# Patient Record
Sex: Female | Born: 1965 | Race: White | Hispanic: No | Marital: Married | Smoking: Never smoker
Health system: Southern US, Community
[De-identification: ages and names within clinical notes are randomized; demographics above are authoritative.]

## PROBLEM LIST (undated history)

## (undated) ENCOUNTER — Ambulatory Visit (HOSPITAL_COMMUNITY): Discharge status: Absent without leave | Payer: BLUE CROSS/BLUE SHIELD

## (undated) DIAGNOSIS — E041 Nontoxic single thyroid nodule: Secondary | ICD-10-CM

## (undated) DIAGNOSIS — N390 Urinary tract infection, site not specified: Secondary | ICD-10-CM

## (undated) DIAGNOSIS — R1031 Right lower quadrant pain: Secondary | ICD-10-CM

## (undated) DIAGNOSIS — J069 Acute upper respiratory infection, unspecified: Secondary | ICD-10-CM

## (undated) DIAGNOSIS — R42 Dizziness and giddiness: Secondary | ICD-10-CM

## (undated) DIAGNOSIS — R002 Palpitations: Secondary | ICD-10-CM

## (undated) DIAGNOSIS — M545 Low back pain, unspecified: Secondary | ICD-10-CM

## (undated) DIAGNOSIS — K219 Gastro-esophageal reflux disease without esophagitis: Secondary | ICD-10-CM

## (undated) DIAGNOSIS — L259 Unspecified contact dermatitis, unspecified cause: Secondary | ICD-10-CM

## (undated) DIAGNOSIS — R634 Abnormal weight loss: Secondary | ICD-10-CM

## (undated) DIAGNOSIS — F411 Generalized anxiety disorder: Secondary | ICD-10-CM

## (undated) DIAGNOSIS — D485 Neoplasm of uncertain behavior of skin: Secondary | ICD-10-CM

## (undated) DIAGNOSIS — E876 Hypokalemia: Secondary | ICD-10-CM

## (undated) DIAGNOSIS — Z0000 Encounter for general adult medical examination without abnormal findings: Secondary | ICD-10-CM

## (undated) DIAGNOSIS — E875 Hyperkalemia: Secondary | ICD-10-CM

## (undated) DIAGNOSIS — Z01419 Encounter for gynecological examination (general) (routine) without abnormal findings: Secondary | ICD-10-CM

## (undated) DIAGNOSIS — I10 Essential (primary) hypertension: Secondary | ICD-10-CM

## (undated) DIAGNOSIS — L299 Pruritus, unspecified: Secondary | ICD-10-CM

## (undated) DIAGNOSIS — K5900 Constipation, unspecified: Secondary | ICD-10-CM

## (undated) DIAGNOSIS — D649 Anemia, unspecified: Secondary | ICD-10-CM

## (undated) DIAGNOSIS — R609 Edema, unspecified: Secondary | ICD-10-CM

## (undated) DIAGNOSIS — J309 Allergic rhinitis, unspecified: Secondary | ICD-10-CM

## (undated) DIAGNOSIS — B373 Candidiasis of vulva and vagina: Secondary | ICD-10-CM

## (undated) DIAGNOSIS — J0110 Acute frontal sinusitis, unspecified: Secondary | ICD-10-CM

## (undated) DIAGNOSIS — D509 Iron deficiency anemia, unspecified: Secondary | ICD-10-CM

## (undated) DIAGNOSIS — R7989 Other specified abnormal findings of blood chemistry: Secondary | ICD-10-CM

## (undated) DIAGNOSIS — N946 Dysmenorrhea, unspecified: Secondary | ICD-10-CM

## (undated) DIAGNOSIS — R300 Dysuria: Secondary | ICD-10-CM

## (undated) DIAGNOSIS — R51 Headache: Secondary | ICD-10-CM

## (undated) DIAGNOSIS — K2100 Gastro-esophageal reflux disease with esophagitis, without bleeding: Secondary | ICD-10-CM

## (undated) DIAGNOSIS — E7800 Pure hypercholesterolemia, unspecified: Secondary | ICD-10-CM

## (undated) DIAGNOSIS — B3731 Acute candidiasis of vulva and vagina: Secondary | ICD-10-CM

## (undated) DIAGNOSIS — S93439A Sprain of tibiofibular ligament of unspecified ankle, initial encounter: Secondary | ICD-10-CM

## (undated) DIAGNOSIS — R0602 Shortness of breath: Secondary | ICD-10-CM

## (undated) DIAGNOSIS — K644 Residual hemorrhoidal skin tags: Secondary | ICD-10-CM

## (undated) DIAGNOSIS — E785 Hyperlipidemia, unspecified: Secondary | ICD-10-CM

## (undated) HISTORY — DX: Essential (primary) hypertension: I10

## (undated) HISTORY — DX: Hyperkalemia: E87.5

## (undated) HISTORY — DX: Acute candidiasis of vulva and vagina: B37.31

## (undated) HISTORY — DX: Anemia, unspecified: D64.9

## (undated) HISTORY — DX: Candidiasis of vulva and vagina: B37.3

## (undated) HISTORY — DX: Hyperlipidemia, unspecified: E78.5

## (undated) HISTORY — DX: Dysuria: R30.0

## (undated) HISTORY — DX: Hypokalemia: E87.6

## (undated) HISTORY — DX: Low back pain, unspecified: M54.50

## (undated) HISTORY — DX: Generalized anxiety disorder: F41.1

## (undated) HISTORY — DX: Nontoxic single thyroid nodule: E04.1

## (undated) HISTORY — DX: Other specified abnormal findings of blood chemistry: R79.89

## (undated) HISTORY — DX: Urinary tract infection, site not specified: N39.0

## (undated) HISTORY — DX: Low back pain: M54.5

## (undated) HISTORY — DX: Abnormal weight loss: R63.4

## (undated) HISTORY — DX: Unspecified contact dermatitis, unspecified cause: L25.9

## (undated) HISTORY — DX: Shortness of breath: R06.02

## (undated) HISTORY — DX: Gastro-esophageal reflux disease with esophagitis, without bleeding: K21.00

## (undated) HISTORY — DX: Dizziness and giddiness: R42

## (undated) HISTORY — DX: Neoplasm of uncertain behavior of skin: D48.5

## (undated) HISTORY — DX: Encounter for general adult medical examination without abnormal findings: Z00.00

## (undated) HISTORY — DX: Palpitations: R00.2

## (undated) HISTORY — DX: Residual hemorrhoidal skin tags: K64.4

## (undated) HISTORY — DX: Acute upper respiratory infection, unspecified: J06.9

## (undated) HISTORY — DX: Pure hypercholesterolemia, unspecified: E78.00

## (undated) HISTORY — DX: Dysmenorrhea, unspecified: N94.6

## (undated) HISTORY — DX: Constipation, unspecified: K59.00

## (undated) HISTORY — DX: Acute frontal sinusitis, unspecified: J01.10

## (undated) HISTORY — DX: Gastro-esophageal reflux disease with esophagitis: K21.0

## (undated) HISTORY — DX: Edema, unspecified: R60.9

## (undated) HISTORY — DX: Headache: R51

## (undated) HISTORY — DX: Allergic rhinitis, unspecified: J30.9

## (undated) HISTORY — DX: Sprain of tibiofibular ligament of unspecified ankle, initial encounter: S93.439A

## (undated) HISTORY — DX: Encounter for gynecological examination (general) (routine) without abnormal findings: Z01.419

## (undated) HISTORY — DX: Right lower quadrant pain: R10.31

## (undated) HISTORY — DX: Iron deficiency anemia, unspecified: D50.9

## (undated) HISTORY — DX: Gastro-esophageal reflux disease without esophagitis: K21.9

## (undated) HISTORY — DX: Pruritus, unspecified: L29.9

---

## 1998-04-04 ENCOUNTER — Other Ambulatory Visit: Admission: RE | Admit: 1998-04-04 | Discharge: 1998-04-04 | Payer: Self-pay | Admitting: Obstetrics and Gynecology

## 1999-05-19 ENCOUNTER — Other Ambulatory Visit: Admission: RE | Admit: 1999-05-19 | Discharge: 1999-05-19 | Payer: Self-pay | Admitting: Obstetrics and Gynecology

## 2000-05-28 ENCOUNTER — Other Ambulatory Visit: Admission: RE | Admit: 2000-05-28 | Discharge: 2000-05-28 | Payer: Self-pay | Admitting: Obstetrics and Gynecology

## 2001-06-23 ENCOUNTER — Other Ambulatory Visit: Admission: RE | Admit: 2001-06-23 | Discharge: 2001-06-23 | Payer: Self-pay | Admitting: Obstetrics and Gynecology

## 2002-05-17 ENCOUNTER — Emergency Department (HOSPITAL_COMMUNITY): Admission: EM | Admit: 2002-05-17 | Discharge: 2002-05-17 | Payer: Self-pay | Admitting: Emergency Medicine

## 2002-08-12 ENCOUNTER — Other Ambulatory Visit: Admission: RE | Admit: 2002-08-12 | Discharge: 2002-08-12 | Payer: Self-pay | Admitting: Obstetrics and Gynecology

## 2003-08-25 ENCOUNTER — Other Ambulatory Visit: Admission: RE | Admit: 2003-08-25 | Discharge: 2003-08-25 | Payer: Self-pay | Admitting: Obstetrics and Gynecology

## 2004-11-10 ENCOUNTER — Other Ambulatory Visit: Admission: RE | Admit: 2004-11-10 | Discharge: 2004-11-10 | Payer: Self-pay | Admitting: Obstetrics and Gynecology

## 2005-06-27 ENCOUNTER — Encounter: Admission: RE | Admit: 2005-06-27 | Discharge: 2005-06-27 | Payer: Self-pay | Admitting: Internal Medicine

## 2009-08-25 ENCOUNTER — Encounter: Admission: RE | Admit: 2009-08-25 | Discharge: 2009-08-25 | Payer: Self-pay | Admitting: Internal Medicine

## 2009-08-26 ENCOUNTER — Ambulatory Visit: Payer: Self-pay | Admitting: Hematology and Oncology

## 2009-08-31 LAB — CBC & DIFF AND RETIC
Basophils Absolute: 0 10*3/uL (ref 0.0–0.1)
Eosinophils Absolute: 0.2 10*3/uL (ref 0.0–0.5)
HCT: 33.8 % — ABNORMAL LOW (ref 34.8–46.6)
HGB: 10.3 g/dL — ABNORMAL LOW (ref 11.6–15.9)
Immature Retic Fract: 20.1 % — ABNORMAL HIGH (ref 0.00–10.70)
MCHC: 30.5 g/dL — ABNORMAL LOW (ref 31.5–36.0)
MONO#: 0.5 10*3/uL (ref 0.1–0.9)
MONO%: 7.6 % (ref 0.0–14.0)
NEUT#: 3.5 10*3/uL (ref 1.5–6.5)
NEUT%: 54.7 % (ref 38.4–76.8)
RDW: 17.2 % — ABNORMAL HIGH (ref 11.2–14.5)
WBC: 6.3 10*3/uL (ref 3.9–10.3)
lymph#: 2.2 10*3/uL (ref 0.9–3.3)

## 2009-08-31 LAB — MORPHOLOGY: PLT EST: NORMAL

## 2009-08-31 LAB — URINALYSIS, MICROSCOPIC - CHCC
Glucose: NEGATIVE g/dL
Protein: NEGATIVE mg/dL

## 2009-09-02 LAB — COMPREHENSIVE METABOLIC PANEL
Calcium: 9.5 mg/dL (ref 8.4–10.5)
Chloride: 104 mEq/L (ref 96–112)
Creatinine, Ser: 0.75 mg/dL (ref 0.40–1.20)
Glucose, Bld: 94 mg/dL (ref 70–99)
Potassium: 4.1 mEq/L (ref 3.5–5.3)
Sodium: 140 mEq/L (ref 135–145)
Total Bilirubin: 0.4 mg/dL (ref 0.3–1.2)
Total Protein: 7.6 g/dL (ref 6.0–8.3)

## 2009-09-02 LAB — IRON AND TIBC: Iron: 23 ug/dL — ABNORMAL LOW (ref 42–145)

## 2009-09-02 LAB — HAPTOGLOBIN: Haptoglobin: 195 mg/dL (ref 16–200)

## 2009-09-02 LAB — VITAMIN B12: Vitamin B-12: 317 pg/mL (ref 211–911)

## 2009-09-02 LAB — PROTEIN ELECTROPHORESIS, SERUM, WITH REFLEX
Alpha-1-Globulin: 4.6 % (ref 2.9–4.9)
Alpha-2-Globulin: 10.4 % (ref 7.1–11.8)
Beta 2: 4.3 % (ref 3.2–6.5)
Beta Globulin: 6.3 % (ref 4.7–7.2)
Total Protein, Serum Electrophoresis: 7.6 g/dL (ref 6.0–8.3)

## 2009-09-02 LAB — HEMOGLOBINOPATHY EVALUATION: Hgb S Quant: 0 % (ref 0.0–0.0)

## 2009-09-02 LAB — FERRITIN: Ferritin: 52 ng/mL (ref 10–291)

## 2009-10-10 ENCOUNTER — Ambulatory Visit: Payer: Self-pay | Admitting: Hematology and Oncology

## 2009-10-12 LAB — URINALYSIS, MICROSCOPIC - CHCC
Blood: NEGATIVE
Ketones: NEGATIVE mg/dL
Protein: NEGATIVE mg/dL

## 2009-10-12 LAB — CBC WITH DIFFERENTIAL/PLATELET
Basophils Absolute: 0 10*3/uL (ref 0.0–0.1)
HCT: 33.5 % — ABNORMAL LOW (ref 34.8–46.6)
NEUT#: 4 10*3/uL (ref 1.5–6.5)
RBC: 4.92 10*6/uL (ref 3.70–5.45)
RDW: 18.3 % — ABNORMAL HIGH (ref 11.2–14.5)

## 2010-02-10 ENCOUNTER — Ambulatory Visit: Payer: Self-pay | Admitting: Hematology and Oncology

## 2010-02-15 LAB — BASIC METABOLIC PANEL
Chloride: 105 mEq/L (ref 96–112)
Creatinine, Ser: 1.08 mg/dL (ref 0.40–1.20)
Glucose, Bld: 112 mg/dL — ABNORMAL HIGH (ref 70–99)
Potassium: 3.7 mEq/L (ref 3.5–5.3)
Sodium: 141 mEq/L (ref 135–145)

## 2010-02-15 LAB — CBC WITH DIFFERENTIAL/PLATELET
Basophils Absolute: 0 10*3/uL (ref 0.0–0.1)
EOS%: 2.1 % (ref 0.0–7.0)
HCT: 34.3 % — ABNORMAL LOW (ref 34.8–46.6)
LYMPH%: 27.6 % (ref 14.0–49.7)
MCHC: 32.7 g/dL (ref 31.5–36.0)
MONO#: 0.6 10*3/uL (ref 0.1–0.9)
MONO%: 8 % (ref 0.0–14.0)
NEUT#: 4.8 10*3/uL (ref 1.5–6.5)
NEUT%: 61.8 % (ref 38.4–76.8)
RBC: 4.97 10*6/uL (ref 3.70–5.45)

## 2010-02-16 LAB — IRON AND TIBC
Iron: 20 ug/dL — ABNORMAL LOW (ref 42–145)
UIBC: 337 ug/dL

## 2010-02-16 LAB — FERRITIN: Ferritin: 55 ng/mL (ref 10–291)

## 2010-02-22 ENCOUNTER — Ambulatory Visit (HOSPITAL_BASED_OUTPATIENT_CLINIC_OR_DEPARTMENT_OTHER): Payer: BC Managed Care – PPO | Admitting: Hematology and Oncology

## 2010-05-23 ENCOUNTER — Encounter: Payer: BC Managed Care – PPO | Admitting: Hematology and Oncology

## 2010-05-23 DIAGNOSIS — D509 Iron deficiency anemia, unspecified: Secondary | ICD-10-CM

## 2010-05-23 LAB — CBC WITH DIFFERENTIAL/PLATELET
EOS%: 1.3 % (ref 0.0–7.0)
HCT: 32.7 % — ABNORMAL LOW (ref 34.8–46.6)
HGB: 10.7 g/dL — ABNORMAL LOW (ref 11.6–15.9)
LYMPH%: 31.6 % (ref 14.0–49.7)
MCH: 22.6 pg — ABNORMAL LOW (ref 25.1–34.0)
MCHC: 32.6 g/dL (ref 31.5–36.0)
MONO#: 0.6 10*3/uL (ref 0.1–0.9)
RBC: 4.72 10*6/uL (ref 3.70–5.45)

## 2010-05-23 LAB — IRON AND TIBC
%SAT: 6 % — ABNORMAL LOW (ref 20–55)
Iron: 23 ug/dL — ABNORMAL LOW (ref 42–145)
TIBC: 354 ug/dL (ref 250–470)

## 2010-05-23 LAB — COMPREHENSIVE METABOLIC PANEL
BUN: 12 mg/dL (ref 6–23)
CO2: 29 mEq/L (ref 19–32)
Calcium: 9.6 mg/dL (ref 8.4–10.5)
Potassium: 3.1 mEq/L — ABNORMAL LOW (ref 3.5–5.3)
Sodium: 137 mEq/L (ref 135–145)
Total Bilirubin: 0.7 mg/dL (ref 0.3–1.2)
Total Protein: 7.4 g/dL (ref 6.0–8.3)

## 2010-05-26 ENCOUNTER — Encounter (HOSPITAL_BASED_OUTPATIENT_CLINIC_OR_DEPARTMENT_OTHER): Payer: BC Managed Care – PPO | Admitting: Hematology and Oncology

## 2010-05-26 DIAGNOSIS — D509 Iron deficiency anemia, unspecified: Secondary | ICD-10-CM

## 2010-11-29 ENCOUNTER — Other Ambulatory Visit: Payer: Self-pay | Admitting: Hematology and Oncology

## 2010-11-29 ENCOUNTER — Encounter (HOSPITAL_BASED_OUTPATIENT_CLINIC_OR_DEPARTMENT_OTHER): Payer: BC Managed Care – PPO | Admitting: Hematology and Oncology

## 2010-11-29 DIAGNOSIS — D509 Iron deficiency anemia, unspecified: Secondary | ICD-10-CM

## 2010-11-29 LAB — BASIC METABOLIC PANEL
BUN: 9 mg/dL (ref 6–23)
CO2: 25 mEq/L (ref 19–32)
Creatinine, Ser: 0.64 mg/dL (ref 0.50–1.10)
Glucose, Bld: 139 mg/dL — ABNORMAL HIGH (ref 70–99)
Potassium: 3.3 mEq/L — ABNORMAL LOW (ref 3.5–5.3)
Sodium: 137 mEq/L (ref 135–145)

## 2010-11-29 LAB — IRON AND TIBC: Iron: 35 ug/dL — ABNORMAL LOW (ref 42–145)

## 2010-11-29 LAB — CBC WITH DIFFERENTIAL/PLATELET
BASO%: 0.5 % (ref 0.0–2.0)
Basophils Absolute: 0 10*3/uL (ref 0.0–0.1)
Eosinophils Absolute: 0.2 10*3/uL (ref 0.0–0.5)
LYMPH%: 32.6 % (ref 14.0–49.7)
MCHC: 33.2 g/dL (ref 31.5–36.0)
MONO#: 0.7 10*3/uL (ref 0.1–0.9)
NEUT#: 4.8 10*3/uL (ref 1.5–6.5)
RBC: 4.92 10*6/uL (ref 3.70–5.45)
lymph#: 2.8 10*3/uL (ref 0.9–3.3)

## 2010-12-07 ENCOUNTER — Encounter (HOSPITAL_BASED_OUTPATIENT_CLINIC_OR_DEPARTMENT_OTHER): Payer: BC Managed Care – PPO | Admitting: Hematology and Oncology

## 2010-12-07 DIAGNOSIS — D509 Iron deficiency anemia, unspecified: Secondary | ICD-10-CM

## 2011-03-29 ENCOUNTER — Telehealth: Payer: Self-pay | Admitting: Hematology and Oncology

## 2011-03-29 NOTE — Telephone Encounter (Signed)
Talked to pt gave her appt for lab and MD on April 2013

## 2011-06-05 ENCOUNTER — Other Ambulatory Visit (HOSPITAL_BASED_OUTPATIENT_CLINIC_OR_DEPARTMENT_OTHER): Payer: BC Managed Care – PPO

## 2011-06-05 DIAGNOSIS — D509 Iron deficiency anemia, unspecified: Secondary | ICD-10-CM

## 2011-06-05 LAB — CBC WITH DIFFERENTIAL/PLATELET
EOS%: 2.1 % (ref 0.0–7.0)
LYMPH%: 36.5 % (ref 14.0–49.7)
MCV: 71.5 fL — ABNORMAL LOW (ref 79.5–101.0)
MONO%: 7.3 % (ref 0.0–14.0)
NEUT%: 53.3 % (ref 38.4–76.8)
RBC: 4.69 10*6/uL (ref 3.70–5.45)
nRBC: 0 % (ref 0–0)

## 2011-06-05 LAB — BASIC METABOLIC PANEL
BUN: 9 mg/dL (ref 6–23)
CO2: 25 mEq/L (ref 19–32)
Creatinine, Ser: 0.67 mg/dL (ref 0.50–1.10)

## 2011-06-05 LAB — IRON AND TIBC
%SAT: 6 % — ABNORMAL LOW (ref 20–55)
Iron: 19 ug/dL — ABNORMAL LOW (ref 42–145)
TIBC: 341 ug/dL (ref 250–470)

## 2011-06-07 ENCOUNTER — Encounter: Payer: Self-pay | Admitting: Hematology and Oncology

## 2011-06-07 ENCOUNTER — Other Ambulatory Visit: Payer: BC Managed Care – PPO

## 2011-06-07 ENCOUNTER — Telehealth: Payer: Self-pay | Admitting: Hematology and Oncology

## 2011-06-07 ENCOUNTER — Ambulatory Visit (HOSPITAL_BASED_OUTPATIENT_CLINIC_OR_DEPARTMENT_OTHER): Payer: BC Managed Care – PPO | Admitting: Hematology and Oncology

## 2011-06-07 ENCOUNTER — Ambulatory Visit: Payer: BC Managed Care – PPO | Admitting: Hematology and Oncology

## 2011-06-07 VITALS — BP 110/70 | HR 69 | Temp 98.2°F | Ht 65.7 in | Wt 171.3 lb

## 2011-06-07 DIAGNOSIS — N920 Excessive and frequent menstruation with regular cycle: Secondary | ICD-10-CM

## 2011-06-07 DIAGNOSIS — D500 Iron deficiency anemia secondary to blood loss (chronic): Secondary | ICD-10-CM

## 2011-06-07 DIAGNOSIS — D539 Nutritional anemia, unspecified: Secondary | ICD-10-CM | POA: Insufficient documentation

## 2011-06-07 NOTE — Patient Instructions (Signed)
Patient to follow up as instructed.   No current outpatient prescriptions on file.        April 2013  Sunday Monday Tuesday Wednesday Thursday Friday Saturday      1   2   3   4   5   6    7   8   9   10   11   12   13    14   15   16    LAB MO     4:00 PM  (15 min.)  Alvina Filbert  [facility] 17   18   EST PT 30  11:00 AM  (30 min.)  Laurice Record, MD  [facility] 19   20    21   22   23   24   25   26   27    28   29    30

## 2011-06-07 NOTE — Progress Notes (Signed)
This office note has been dictated.

## 2011-06-07 NOTE — Progress Notes (Signed)
CC:   Joyce Spikes, DO James L. Malon Kindle., M.D. Richard M. Marcelle Overlie, M.D.  IDENTIFYING STATEMENT:  The patient is a 46 year old woman with anemia who presents for followup.  INTERVAL HISTORY:  The patient reports ongoing heavy menses.  Is taking oral iron with very minimal difficulty.  Has good energy levels.  Is not short of breath.  Her weight remains stable.  MEDICATIONS:  Reviewed and updated.  ALLERGIES:  Crestor, sulfa, Vioxx.  PHYSICAL EXAMINATION:  The patient is alert and oriented x3.  Vitals: pulse 69, blood pressure 110/70, temperature 98.2, respirations 18, weight 101.3 pounds.  HEENT:  head is atraumatic, normocephalic. Sclerae anicteric.  Mouth moist.  Chest:  Clear.  Abdomen:  Soft, nontender.  Bowel sounds present.  Extremities:  No edema.  LABORATORY DATA:  06/05/2011:  White cell count 7.7, hemoglobin 10.6 (11.8), hematocrit 33.5, platelets 305.  Sodium 138, potassium 3, chloride 96, CO2 25, BUN 9, creatinine 0.67, glucose 110.  Iron 19 (35), TIBC 341 (374), ferritin 74.  IMPRESSION AND PLAN:  Joyce Richard is a 46 year old woman with iron- deficiency anemia secondary to heavy menses.  Ferritin levels remain adequate.  I have recommend she continue with oral iron replacement therapy.  I have also recommended she follow up with her gynecologist. She follows up in 9 months' time or sooner if needed.    ______________________________ Joyce Richard, M.D. LIO/MEDQ  D:  06/07/2011  T:  06/07/2011  Job:  [id number]

## 2011-06-07 NOTE — Telephone Encounter (Signed)
appts made and printed for pt aom °

## 2011-08-26 IMAGING — US US SOFT TISSUE HEAD/NECK
1 series · 14 of 25 positions shown · non-contrast
Comparison: None.

CLINICAL DATA: Left thyroid nodule on physical exam

THYROID ULTRASOUND
TECHNIQUE: Ultrasound examination of the thyroid gland and adjacent
soft tissues was performed.

[Series 1: us soft tissue head/neck · 0.08mm/px · 14 of 26 slices shown]
[im 1/26]
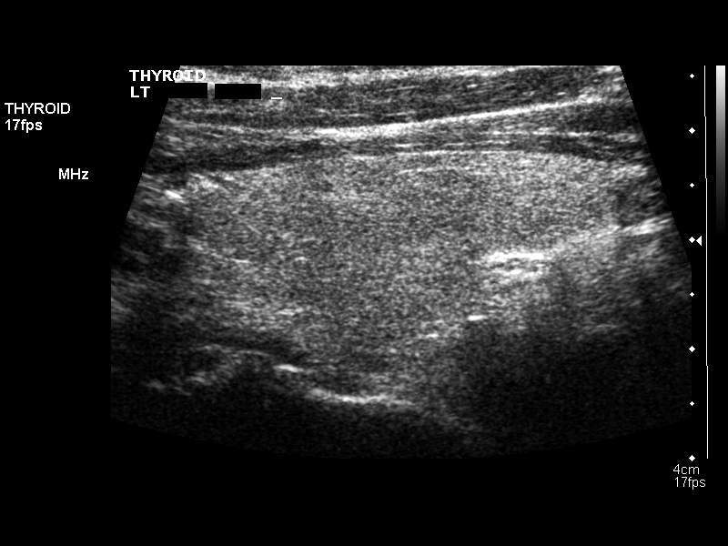
[im 3/26]
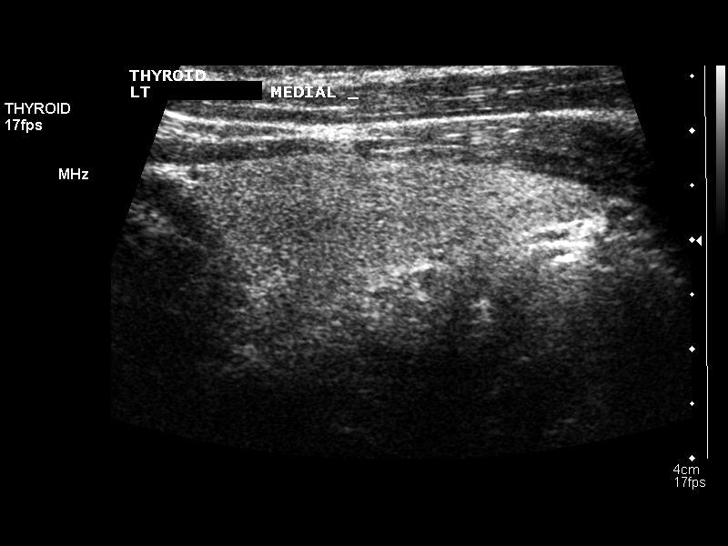
[im 5/26]
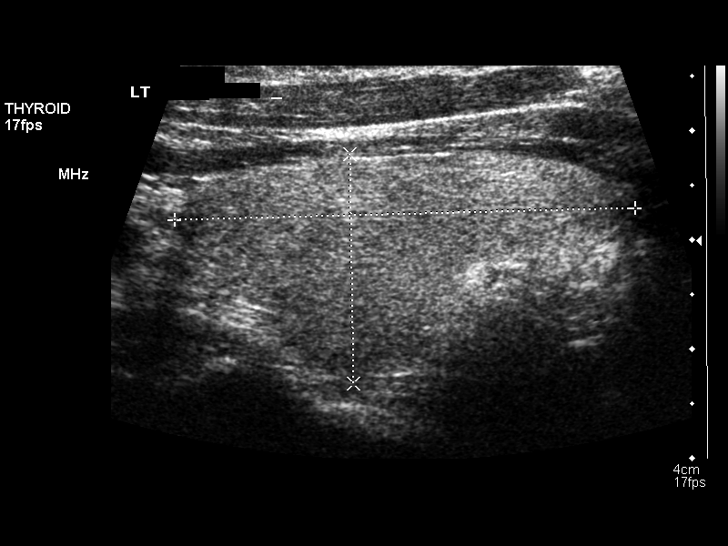
[im 7/26]
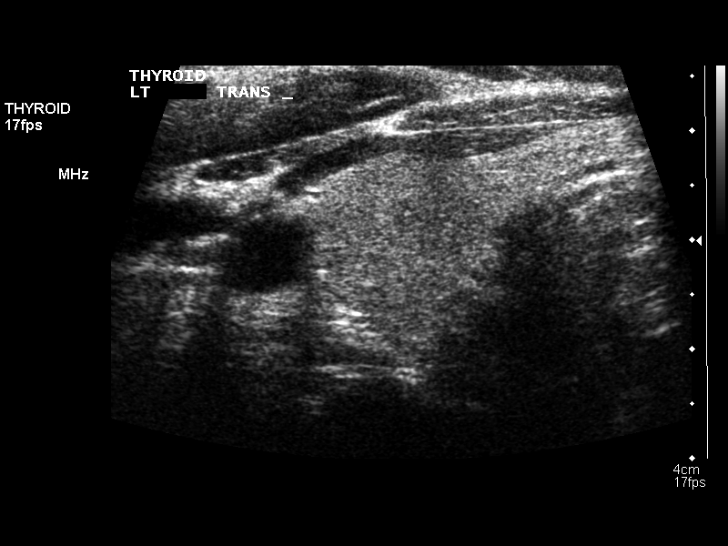
[im 9/26]
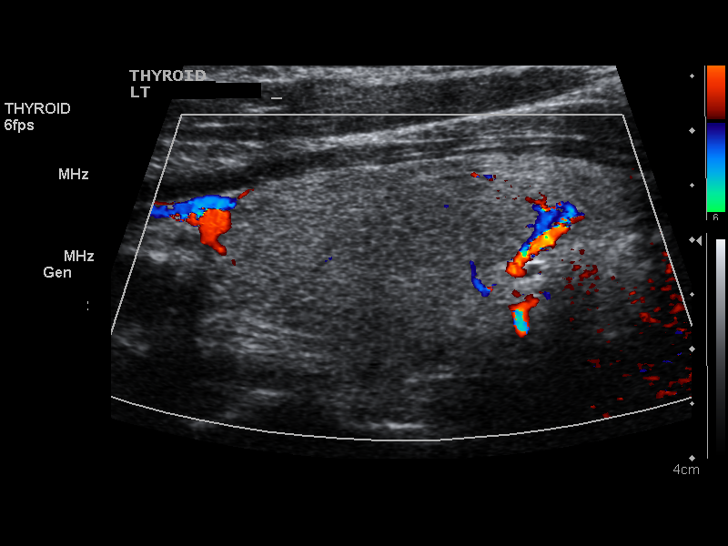
[im 10/26]
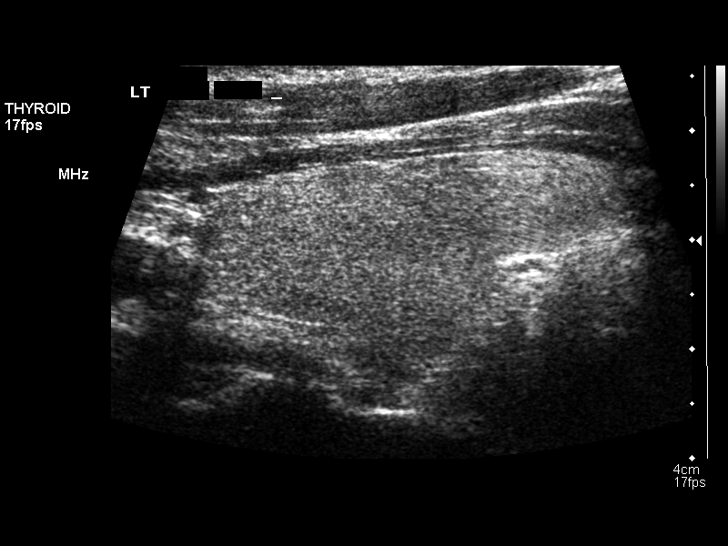
[im 12/26]
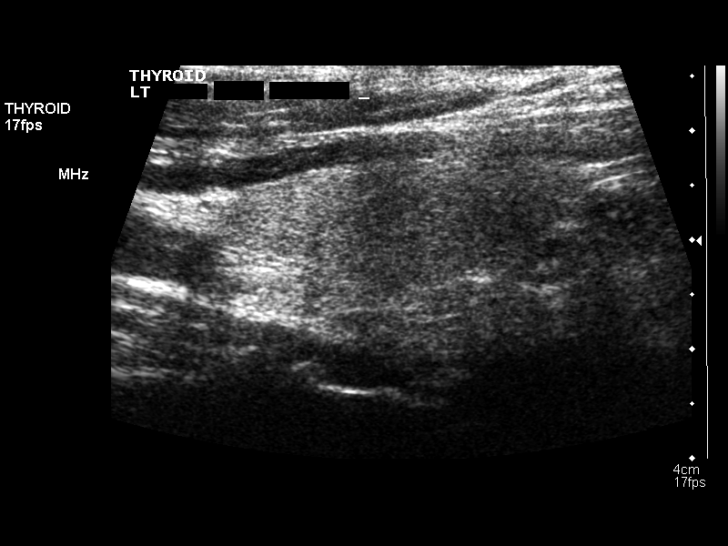
[im 14/26]
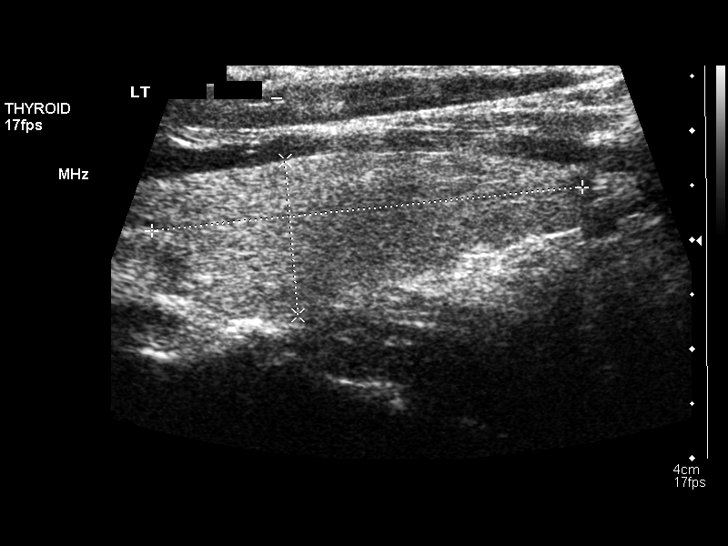
[im 16/26]
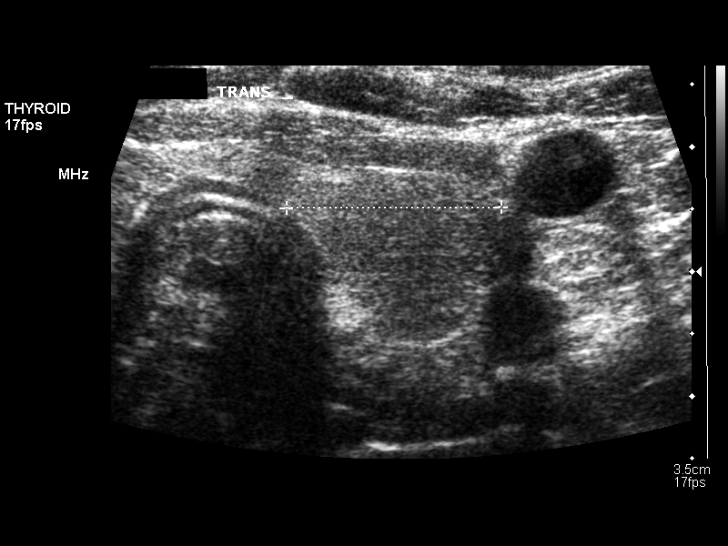
[im 17/26]
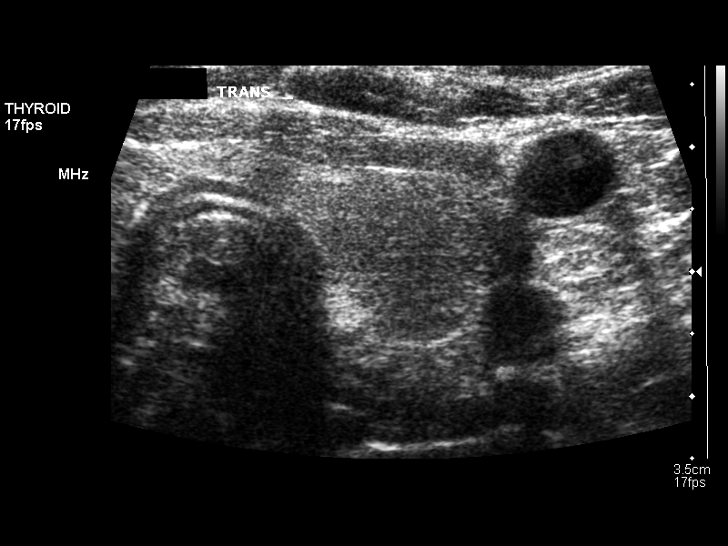
[im 19/26]
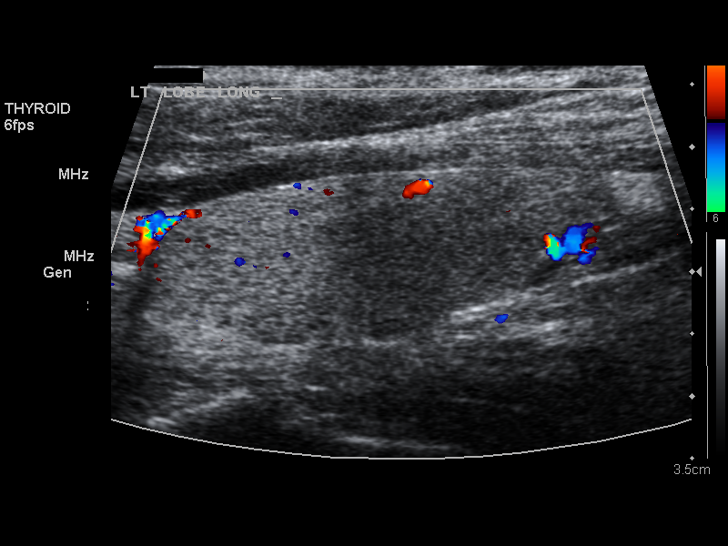
[im 21/26]
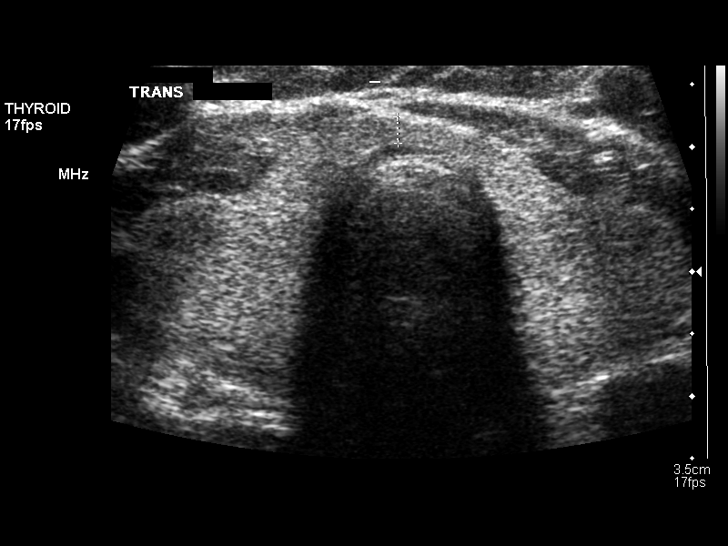
[im 23/26]
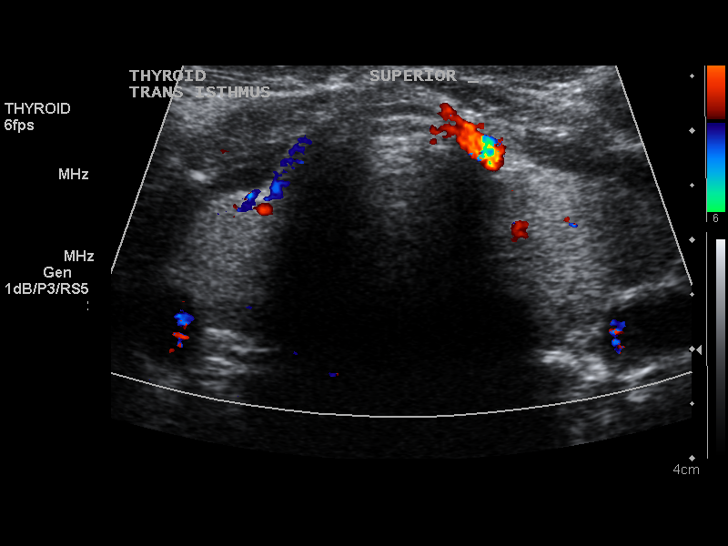
[im 26/26]
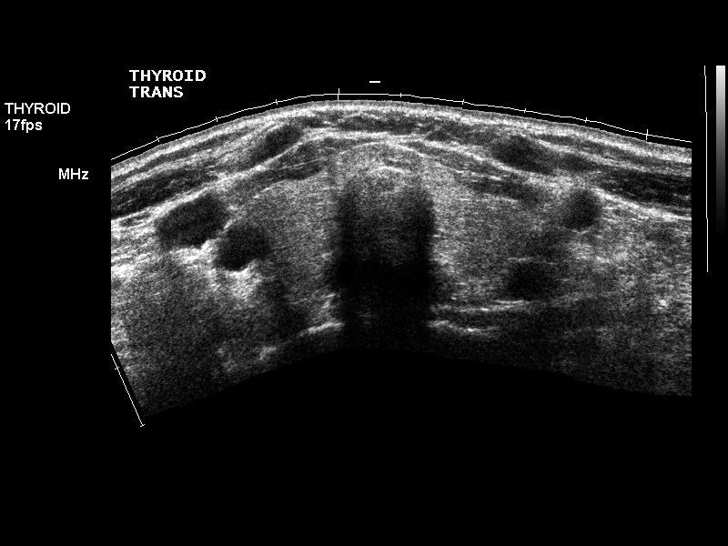

[14 of 25 positions shown; findings below may reference images not displayed]

FINDINGS: Right thyroid lobe:  4.2 x 2.1 x 2.1 cm.
Left thyroid lobe:  4.0 x 1.4 x 1.7 cm.
Isthmus:  2 mm

Focal nodules:  No solid or cystic nodules are seen.  The thyroid
gland is homogeneous in echogenicity.

Lymphadenopathy:  Absent
IMPRESSION: Negative ultrasound of the thyroid.  The thyroid gland is normal in
size with no solid or cystic nodule noted.

## 2012-02-23 ENCOUNTER — Encounter: Payer: Self-pay | Admitting: *Deleted

## 2012-02-23 ENCOUNTER — Telehealth: Payer: Self-pay | Admitting: Oncology

## 2012-02-23 NOTE — Telephone Encounter (Signed)
Former pt of LO reassigned to [facility]. S/w pt today re reassinment and appts for 1/28 and 2/4.

## 2012-03-03 ENCOUNTER — Other Ambulatory Visit: Payer: BC Managed Care – PPO | Admitting: Lab

## 2012-03-05 ENCOUNTER — Ambulatory Visit: Payer: BC Managed Care – PPO | Admitting: Nurse Practitioner

## 2012-03-05 ENCOUNTER — Ambulatory Visit: Payer: BC Managed Care – PPO | Admitting: Hematology and Oncology

## 2012-03-18 ENCOUNTER — Other Ambulatory Visit (HOSPITAL_BASED_OUTPATIENT_CLINIC_OR_DEPARTMENT_OTHER): Payer: BC Managed Care – PPO | Admitting: Lab

## 2012-03-18 DIAGNOSIS — D500 Iron deficiency anemia secondary to blood loss (chronic): Secondary | ICD-10-CM

## 2012-03-18 DIAGNOSIS — D539 Nutritional anemia, unspecified: Secondary | ICD-10-CM

## 2012-03-18 LAB — CBC WITH DIFFERENTIAL/PLATELET
BASO%: 0.8 % (ref 0.0–2.0)
Basophils Absolute: 0.1 10*3/uL (ref 0.0–0.1)
EOS%: 2.5 % (ref 0.0–7.0)
HCT: 33.5 % — ABNORMAL LOW (ref 34.8–46.6)
HGB: 10.7 g/dL — ABNORMAL LOW (ref 11.6–15.9)
LYMPH%: 36.6 % (ref 14.0–49.7)
MCH: 22.4 pg — ABNORMAL LOW (ref 25.1–34.0)
MCHC: 32 g/dL (ref 31.5–36.0)
NEUT%: 53.5 % (ref 38.4–76.8)
Platelets: 304 10*3/uL (ref 145–400)

## 2012-03-18 LAB — IRON AND TIBC
%SAT: 5 % — ABNORMAL LOW (ref 20–55)
UIBC: 346 ug/dL (ref 125–400)

## 2012-03-18 LAB — BASIC METABOLIC PANEL (CC13)
CO2: 26 mEq/L (ref 22–29)
Calcium: 9.4 mg/dL (ref 8.4–10.4)
Chloride: 102 mEq/L (ref 98–107)
Creatinine: 0.8 mg/dL (ref 0.6–1.1)
Sodium: 139 mEq/L (ref 136–145)

## 2012-03-18 LAB — FERRITIN: Ferritin: 90 ng/mL (ref 10–291)

## 2012-03-25 ENCOUNTER — Telehealth: Payer: Self-pay | Admitting: Oncology

## 2012-03-25 ENCOUNTER — Ambulatory Visit (HOSPITAL_BASED_OUTPATIENT_CLINIC_OR_DEPARTMENT_OTHER): Payer: BC Managed Care – PPO | Admitting: Oncology

## 2012-03-25 VITALS — BP 140/81 | HR 86 | Temp 98.9°F | Resp 18 | Ht 65.7 in | Wt 162.4 lb

## 2012-03-25 DIAGNOSIS — D539 Nutritional anemia, unspecified: Secondary | ICD-10-CM

## 2012-03-25 DIAGNOSIS — D509 Iron deficiency anemia, unspecified: Secondary | ICD-10-CM

## 2012-03-25 NOTE — Telephone Encounter (Signed)
Gave pt appt for lab before MD visit on March 2014

## 2012-03-25 NOTE — Progress Notes (Signed)
Hematology and Oncology Follow Up Visit  Joyce Richard [id number] 03-Jun-1965 47 y.o. 03/25/2012 6:19 PM   Principle Diagnosis: Encounter Diagnosis  Name Primary?  Marland Kitchen Unspecified deficiency anemia Yes     Interim History:  I will be assuming the hematology care of this patient since Dr. Dalene Carrow left the practice  47 year old woman who is been in overall good health without any major medical or surgical illness referred to this office back in 2011/08/01for further evaluation of microcytic anemia. This was felt to be an iron deficiency anemia based on low serum iron and low percent transferrin saturation. However serum ferritins have always been normal. A number of additional studies were done 2 years ago in 09-19-09. There was no evidence for a hemolytic process. Reticulocyte count 1%, bilirubin 0.4, haptoglobin 195. Serum protein electrophoresis was normal and there were no monoclonal proteins on immunofixation electrophoresis. B12 level was normal at 317 Coombs test was negative. Urinalysis showed moderate blood. Iron studies with serum iron 23 TIBC 364 per saturation 6, ferritin 52. Hemoglobin electrophoresis was done and showed hemoglobin A 97.8%, hemoglobin A2, 2.2%, 0% hemoglobin F  Reviewing hemoglobins done over the last 3 years, there has been little change despite iron supplementation. Hemoglobin is consistently 11 g plus or  minus 0.5 g. Iron studies continue to show the same unusual pattern. On 03/18/2012 hemoglobin 10.7, MCV 70, iron 20, percent saturation 5, TIBC 366, ferritin 90. Folic acid greater than 20. B12 457.  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 She has no exposure to organic chemicals or lead. No exposure to high-dose insecticides. No exposure to therapeutic dose radiation.  No significant family history of anemia. She has one sister who is alive and healthy at age 61. As I recall, her mother was murdered and her father died in a  motor vehicle accident.  Recently she has developed a extensive, erythematous, cutaneous rash on her face and that area. She is currently being evaluated by a local dermatologist Dr. Terri Piedra. She has no signs or symptoms of a collagen vascular disorder and specifically denies any polyarthralgia or polymyalgia. No paresthesias.  Abbreviated medical history: Hypertension on low-dose diuretic. Reflux esophagitis on when necessary antacids.  Abbreviated social history. Married. No children. Never pregnant. No alcohol. No tobacco. Her husband is currently under evaluation for a brain tumor and will be having surgery soon.    Medications: reviewed  Allergies:  Allergies  Allergen Reactions  . Penicillins     Pt was told by father  NOT to take Penicillins.    Review of Systems: Constitutional:   No constitutional symptoms Respiratory: No cough or dyspnea Cardiovascular:  No chest pain or palpitations Gastrointestinal: No change in bowel habits, no hematochezia or melena Genito-Urinary: No hematuria, no vaginal bleeding Musculoskeletal: See above Neurologic: No headache or change in vision Skin: See above Remaining ROS negative.  Physical Exam: Blood pressure 140/81, pulse 86, temperature 98.9 F (37.2 C), temperature source Oral, resp. rate 18, height 5' 5.7" (1.669 m), weight 162 lb 6.4 oz (73.664 kg). Wt Readings from Last 3 Encounters:  03/25/12 162 lb 6.4 oz (73.664 kg)  06/07/11 171 lb 4.8 oz (77.701 kg)     General appearance: Well-nourished Caucasian woman HENNT: Pharynx no erythema or exudate Lymph nodes: No lymphadenopathy Breasts: Lungs: Clear to auscultation resonant to percussion Heart: Regular rhythm no murmur Abdomen: Soft, nontender, no mass, no organomegaly Extremities: No edema, no calf tenderness Vascular:  No cyanosis Neurologic: PERRLA, optic discs sharp on the right not well visualized on the left, motor strength 5 over 5, reflexes 2+ symmetric, sensation  intact to vibration over the fingertips by tuning fork exam. Skin: Angry, erythematous rash in the malar area of her face extending down the neck and onto the bed the area of her chest  Lab Results: Lab Results  Component Value Date   WBC 7.9 03/18/2012   HGB 10.7* 03/18/2012   HCT 33.5* 03/18/2012   MCV 70.1* 03/18/2012   PLT 304 03/18/2012     Chemistry      Component Value Date/Time   NA 139 03/18/2012 1604   NA 138 06/05/2011 1607   K 3.2* 03/18/2012 1604   K 3.0* 06/05/2011 1607   CL 102 03/18/2012 1604   CL 96 06/05/2011 1607   CO2 26 03/18/2012 1604   CO2 25 06/05/2011 1607   BUN 12.9 03/18/2012 1604   BUN 9 06/05/2011 1607   CREATININE 0.8 03/18/2012 1604   CREATININE 0.67 06/05/2011 1607      Component Value Date/Time   CALCIUM 9.4 03/18/2012 1604   CALCIUM 9.3 06/05/2011 1607   ALKPHOS 33* 05/23/2010 1600   AST 20 05/23/2010 1600   ALT 29 05/23/2010 1600   BILITOT 0.7 05/23/2010 1600       Impression and Plan: Chronic but stable microcytic anemia with atypical pattern of iron studies with low serum iron and percent saturation of transferrin but consistently normal ferritin levels. No improvement on oral iron supplements. Normal hemoglobin electrophoresis. No evidence for a hemolytic process.  This is not a straightforward iron deficiency anemia. A number of possibilities come to mind. She may have a problem with the transferrin receptor. I will check a soluble transferrin receptor level. She does work in a toxic environment. Although what she breathes in  is primarily steel dust, lead poisoning could have this picture. I am going to get a plasma heavy metal screen. Although she is young, myelodysplastic syndrome typically presents like this with a microcytic anemia in the face of an elevated ferritin. She does not appear to have a chronic inflammatory process to explain the elevated ferritin. The current cutaneous facial and rib area rash appeared only recently. She has no signs or  symptoms of a collagen vascular disorder.  Plan: Check soluble transferrin receptor level, plasma heavy metal screen, erythropoietin level which should typically be elevated with iron deficiency. I feel that if these studies are not diagnostic  I will need to do a bone marrow aspiration and biopsy to rule out a myelodysplastic syndrome. We may have to delay this since her husband is having brain surgery soon. Hemoglobins have been stable so there is no emergency.   CC:. Dr. Diamantina Monks, [facility] adult medicine, and Dr. Para Skeans   Levert Feinstein, MD 2/4/20146:19 PM

## 2012-04-17 ENCOUNTER — Other Ambulatory Visit (HOSPITAL_BASED_OUTPATIENT_CLINIC_OR_DEPARTMENT_OTHER): Payer: BC Managed Care – PPO | Admitting: Lab

## 2012-04-17 ENCOUNTER — Other Ambulatory Visit: Payer: Self-pay | Admitting: Oncology

## 2012-04-17 DIAGNOSIS — D509 Iron deficiency anemia, unspecified: Secondary | ICD-10-CM

## 2012-04-17 DIAGNOSIS — D539 Nutritional anemia, unspecified: Secondary | ICD-10-CM

## 2012-04-17 LAB — CBC WITH DIFFERENTIAL/PLATELET
Basophils Absolute: 0 10*3/uL (ref 0.0–0.1)
Eosinophils Absolute: 0.2 10*3/uL (ref 0.0–0.5)
HGB: 11.3 g/dL — ABNORMAL LOW (ref 11.6–15.9)
MONO#: 0.4 10*3/uL (ref 0.1–0.9)
NEUT#: 3.3 10*3/uL (ref 1.5–6.5)
RBC: 5.06 10*6/uL (ref 3.70–5.45)
RDW: 16 % — ABNORMAL HIGH (ref 11.2–14.5)
WBC: 6.6 10*3/uL (ref 3.9–10.3)
lymph#: 2.6 10*3/uL (ref 0.9–3.3)
nRBC: 0 % (ref 0–0)

## 2012-04-17 LAB — MORPHOLOGY: PLT EST: ADEQUATE

## 2012-04-17 LAB — CHCC SMEAR

## 2012-04-21 LAB — HEAVY METALS, BLOOD: Lead: <2 ug/dL (ref <10)

## 2012-04-21 LAB — ERYTHROPOIETIN: Erythropoietin: 21.6 m[IU]/mL — ABNORMAL HIGH (ref 2.6–18.5)

## 2012-04-25 ENCOUNTER — Telehealth: Payer: Self-pay | Admitting: Oncology

## 2012-04-25 ENCOUNTER — Ambulatory Visit: Payer: BC Managed Care – PPO | Admitting: Oncology

## 2012-04-25 NOTE — Telephone Encounter (Signed)
Pt called and r/s appt to July 2014 MD only nurse notified

## 2012-06-02 ENCOUNTER — Ambulatory Visit: Payer: Self-pay | Admitting: Internal Medicine

## 2012-06-06 ENCOUNTER — Encounter: Payer: Self-pay | Admitting: *Deleted

## 2012-06-09 ENCOUNTER — Encounter: Payer: Self-pay | Admitting: Internal Medicine

## 2012-06-09 ENCOUNTER — Ambulatory Visit (INDEPENDENT_AMBULATORY_CARE_PROVIDER_SITE_OTHER): Payer: BC Managed Care – PPO | Admitting: Internal Medicine

## 2012-06-09 VITALS — BP 102/77 | HR 65 | Temp 98.1°F | Resp 16 | Ht 71.0 in | Wt 170.0 lb

## 2012-06-09 DIAGNOSIS — N390 Urinary tract infection, site not specified: Secondary | ICD-10-CM

## 2012-06-09 DIAGNOSIS — E876 Hypokalemia: Secondary | ICD-10-CM

## 2012-06-09 DIAGNOSIS — D649 Anemia, unspecified: Secondary | ICD-10-CM

## 2012-06-09 DIAGNOSIS — R634 Abnormal weight loss: Secondary | ICD-10-CM

## 2012-06-09 DIAGNOSIS — M545 Low back pain, unspecified: Secondary | ICD-10-CM

## 2012-06-09 LAB — POCT URINALYSIS DIPSTICK
Bilirubin, UA: NEGATIVE
Blood, UA: NEGATIVE
Glucose, UA: NEGATIVE
Ketones, UA: NEGATIVE
Leukocytes, UA: NEGATIVE
Nitrite, UA: NEGATIVE
Protein, UA: NEGATIVE
Spec Grav, UA: 1.025
Urobilinogen, UA: NEGATIVE
pH, UA: 6

## 2012-06-09 MED ORDER — FE FUM-VIT C-VIT B12-FA 460-60-0.01-1 MG PO CAPS: 1.0000 | ORAL_CAPSULE | Freq: Every day | ORAL | Status: DC

## 2012-06-09 NOTE — Progress Notes (Signed)
Patient ID: Joyce Richard, female   DOB: 1965/10/18, 47 y.o.   MRN: [id number] Code Status: full code  Allergies  Allergen Reactions  . Crestor (Rosuvastatin)   . Other     Cranberries   . Penicillins     Pt was told by father  NOT to take Penicillins.  . Sulfa Antibiotics   . Vioxx (Rofecoxib)     Chief Complaint  Patient presents with  . Medical Managment of Chronic Issues    patient thinks she has a UTI    HPI: Patient is a 47 y.o. white female seen in the office today for routine mgt of chronic conditions.  Stressed caring for husband who just had surgery for brain tumor.  Doing better except he has headaches once in a while.  His mom is also helping to look after him.  Pt is still working herself.    Doing ok.  Feels like she has a UTI.  Low back pain, but may be due to lifting things.  No frequency, urgency.  Talked about safe lifting.    Discussed low carb diet due to hyperglycemia last time.  Review of Systems:  Review of Systems  Constitutional: Positive for weight loss and malaise/fatigue. Negative for fever and chills.  Eyes: Negative for blurred vision.  Respiratory: Negative for shortness of breath.   Cardiovascular: Negative for chest pain.  Gastrointestinal: Negative for constipation, blood in stool and melena.  Genitourinary: Positive for flank pain. Negative for dysuria, urgency and frequency.  Musculoskeletal: Positive for back pain. Negative for falls.  Skin: Negative for rash.  Neurological: Negative for dizziness, weakness and headaches.  Endo/Heme/Allergies: Does not bruise/bleed easily.  Psychiatric/Behavioral: Negative for depression.     Past Medical History  Diagnosis Date  . Hyperpotassemia   . Other abnormal blood chemistry   . Nontoxic uninodular goiter   . Essential hypertension, benign   . Routine general medical examination at a health care facility   . Anemia, unspecified   . Candidiasis of vulva and vagina   . Dysuria   . Anxiety  state, unspecified   . Palpitations   . Shortness of breath   . Routine gynecological examination   . Esophageal reflux   . Acute frontal sinusitis   . Unspecified pruritic disorder   . Other and unspecified hyperlipidemia   . Other abnormal blood chemistry   . Neoplasm of uncertain behavior of skin   . Sprain of tibiofibular (ligament), distal of ankle   . Unspecified constipation   . Hypopotassemia   . Iron deficiency anemia, unspecified   . Unspecified essential hypertension   . Contact dermatitis and other eczema, due to unspecified cause   . Loss of weight   . Dysuria   . Edema   . Urinary tract infection, site not specified   . Lumbago   . Headache   . Dysmenorrhea   . Dizziness and giddiness   . Abdominal pain, right lower quadrant   . Pure hypercholesterolemia   . External hemorrhoids without mention of complication   . Allergic rhinitis, cause unspecified   . Acute upper respiratory infections of unspecified site   . Reflux esophagitis    No past surgical history on file. Social History:   reports that she has never smoked. She does not have any smokeless tobacco history on file. Her alcohol and drug histories are not on file.  No family history on file.  Medications: Patient's Medications  New Prescriptions   No medications on  file  Previous Medications   CINNAMON PO    Take 2 tablets by mouth daily.   DOCUSATE SODIUM (COLACE) 50 MG CAPSULE    Take by mouth at bedtime.   FISH OIL-OMEGA-3 FATTY ACIDS 1000 MG CAPSULE    Take by mouth daily.   HYDROCHLOROTHIAZIDE (HYDRODIURIL) 25 MG TABLET    Take 25 mg by mouth daily.   POTASSIUM BICARBONATE (K-LYTE) 25 MEQ DISINTEGRATING TABLET    Take 25 mEq by mouth daily.  Modified Medications   Modified Medication Previous Medication   FE FUM-VIT C-VIT B12-FA (HEMATOGEN FORTE) 460-60-0.01-1 MG CAPS Fe Fum-Vit C-Vit B12-FA (HEMATOGEN FORTE) 460-60-0.01-1 MG CAPS      Take 1 capsule by mouth daily.    Take 1 capsule by  mouth daily.  Discontinued Medications   No medications on file     Physical Exam: Filed Vitals:   06/09/12 1637  BP: 102/77  Pulse: 65  Temp: 98.1 F (36.7 C)  TempSrc: Oral  Resp: 16  Height: 5\' 11"  (1.803 m)  Weight: 170 lb (77.111 kg)  SpO2: 99%  Physical Exam  Constitutional: She is oriented to person, place, and time. She appears well-developed and well-nourished. No distress.  HENT:  Head: Normocephalic and atraumatic.  Cardiovascular: Normal rate, regular rhythm, normal heart sounds and intact distal pulses.   Pulmonary/Chest: Effort normal and breath sounds normal. No respiratory distress.  Abdominal: Soft. Bowel sounds are normal. She exhibits no distension. There is no tenderness.  Musculoskeletal: Normal range of motion. She exhibits no edema.  Low back tenderness over paravertebral muscles  Neurological: She is alert and oriented to person, place, and time.  Skin: Skin is warm and dry.   Labs reviewed: Basic Metabolic Panel:  Recent Labs  40/98/11 1604  NA 139  K 3.2*  CL 102  CO2 26  GLUCOSE 87  BUN 12.9  CREATININE 0.8  CALCIUM 9.4  CBC:  Recent Labs  03/18/12 1604 04/17/12 1604  WBC 7.9 6.6  NEUTROABS 4.2 3.3  HGB 10.7* 11.3*  HCT 33.5* 36.3  MCV 70.1* 71.7*  PLT 304 350   Assessment/Plan  1. UTI (lower urinary tract infection) - POC Urinalysis Dipstick--was negative--does NOT have UTI  2. Hypopotassemia - CMP; Future  3. Anemia, unspecified - CBC with Differential; Future  4. Loss of weight -etiology unclear--may be due to overworking and taking care of her husband who has brain tumor--stress and not enough time to eat properly -will r/o hyperthyroidism, diabetes as causes - CMP; Future - CBC with Differential; Future - TSH; Future - Hemoglobin A1c; Future  5.  Screening for hyperlipidemia:   - FLP  6.  Low back pain:  Suspect due to lifting at work --discussed lifting with her legs not back and not turning while  lifting --proper stretches  Labs/tests ordered:  Cbc, cmp, flp, hba1c, tsh, urine dipstick was done and negative

## 2012-06-10 LAB — CBC WITH DIFFERENTIAL/PLATELET
Basophils Absolute: 0 10*3/uL (ref 0.0–0.2)
Basos: 1 % (ref 0–3)
Eos: 2 % (ref 0–5)
Eosinophils Absolute: 0.1 10*3/uL (ref 0.0–0.4)
HCT: 35.4 % (ref 34.0–46.6)
Hemoglobin: 11.1 g/dL (ref 11.1–15.9)
Immature Grans (Abs): 0 10*3/uL (ref 0.0–0.1)
Immature Granulocytes: 0 % (ref 0–2)
Lymphocytes Absolute: 2.9 10*3/uL (ref 0.7–3.1)
Lymphs: 34 % (ref 14–46)
MCH: 22.4 pg — ABNORMAL LOW (ref 26.6–33.0)
MCHC: 31.4 g/dL — ABNORMAL LOW (ref 31.5–35.7)
MCV: 72 fL — ABNORMAL LOW (ref 79–97)
Monocytes Absolute: 0.5 10*3/uL (ref 0.1–0.9)
Monocytes: 6 % (ref 4–12)
Neutrophils Absolute: 5 10*3/uL (ref 1.4–7.0)
Neutrophils Relative %: 57 % (ref 40–74)
RBC: 4.95 x10E6/uL (ref 3.77–5.28)
RDW: 17.6 % — ABNORMAL HIGH (ref 12.3–15.4)
WBC: 8.7 10*3/uL (ref 3.4–10.8)

## 2012-06-10 LAB — COMPREHENSIVE METABOLIC PANEL
ALT: 37 IU/L — ABNORMAL HIGH (ref 0–32)
AST: 24 IU/L (ref 0–40)
Albumin/Globulin Ratio: 2 (ref 1.1–2.5)
Albumin: 4.9 g/dL (ref 3.5–5.5)
Alkaline Phosphatase: 40 IU/L (ref 39–117)
BUN/Creatinine Ratio: 21 (ref 9–23)
BUN: 16 mg/dL (ref 6–24)
CO2: 28 mmol/L (ref 19–28)
Calcium: 10 mg/dL (ref 8.7–10.2)
Chloride: 100 mmol/L (ref 97–108)
Creatinine, Ser: 0.76 mg/dL (ref 0.57–1.00)
GFR calc Af Amer: 107 mL/min/{1.73_m2} (ref >59)
GFR calc non Af Amer: 93 mL/min/{1.73_m2} (ref >59)
Globulin, Total: 2.4 g/dL (ref 1.5–4.5)
Glucose: 88 mg/dL (ref 65–99)
Potassium: 4.1 mmol/L (ref 3.5–5.2)
Sodium: 141 mmol/L (ref 134–144)
Total Bilirubin: 0.3 mg/dL (ref 0.0–1.2)
Total Protein: 7.3 g/dL (ref 6.0–8.5)

## 2012-06-10 LAB — HEMOGLOBIN A1C
Est. average glucose Bld gHb Est-mCnc: 146 mg/dL
Hgb A1c MFr Bld: 6.7 % — ABNORMAL HIGH (ref 4.8–5.6)

## 2012-06-10 LAB — LIPID PANEL
Chol/HDL Ratio: 4.6 ratio units — ABNORMAL HIGH (ref 0.0–4.4)
Cholesterol, Total: 204 mg/dL — ABNORMAL HIGH (ref 100–199)
HDL: 44 mg/dL (ref >39)
LDL Calculated: 139 mg/dL — ABNORMAL HIGH (ref 0–99)
Triglycerides: 106 mg/dL (ref 0–149)
VLDL Cholesterol Cal: 21 mg/dL (ref 5–40)

## 2012-06-10 LAB — TSH: TSH: 2.85 u[IU]/mL (ref 0.450–4.500)

## 2012-07-10 ENCOUNTER — Other Ambulatory Visit: Payer: Self-pay | Admitting: Internal Medicine

## 2012-08-18 ENCOUNTER — Other Ambulatory Visit: Payer: Self-pay | Admitting: *Deleted

## 2012-08-25 ENCOUNTER — Ambulatory Visit: Payer: BC Managed Care – PPO | Admitting: Oncology

## 2012-10-19 ENCOUNTER — Other Ambulatory Visit: Payer: Self-pay | Admitting: Internal Medicine

## 2012-11-19 LAB — HM PAP SMEAR

## 2012-11-19 LAB — HM MAMMOGRAPHY

## 2012-12-12 ENCOUNTER — Ambulatory Visit (INDEPENDENT_AMBULATORY_CARE_PROVIDER_SITE_OTHER): Payer: BC Managed Care – PPO | Admitting: Internal Medicine

## 2012-12-12 ENCOUNTER — Ambulatory Visit: Payer: Self-pay | Admitting: Internal Medicine

## 2012-12-12 ENCOUNTER — Encounter: Payer: Self-pay | Admitting: Internal Medicine

## 2012-12-12 VITALS — BP 128/70 | HR 71 | Temp 97.3°F | Wt 174.0 lb

## 2012-12-12 DIAGNOSIS — Z780 Asymptomatic menopausal state: Secondary | ICD-10-CM

## 2012-12-12 DIAGNOSIS — D509 Iron deficiency anemia, unspecified: Secondary | ICD-10-CM

## 2012-12-12 DIAGNOSIS — R739 Hyperglycemia, unspecified: Secondary | ICD-10-CM

## 2012-12-12 DIAGNOSIS — E785 Hyperlipidemia, unspecified: Secondary | ICD-10-CM

## 2012-12-12 DIAGNOSIS — I10 Essential (primary) hypertension: Secondary | ICD-10-CM

## 2012-12-12 DIAGNOSIS — R7309 Other abnormal glucose: Secondary | ICD-10-CM

## 2012-12-12 DIAGNOSIS — N951 Menopausal and female climacteric states: Secondary | ICD-10-CM

## 2012-12-12 MED ORDER — FE FUM-VIT C-VIT B12-FA 460-60-0.01-1 MG PO CAPS: ORAL_CAPSULE | ORAL | Status: DC

## 2012-12-12 NOTE — Progress Notes (Signed)
Patient ID: Joyce Richard, female   DOB: 08-Jan-1966, 47 y.o.   MRN: [id number] Location:  [facility]  Code Status: full code  Allergies  Allergen Reactions  . Crestor [Rosuvastatin]   . Other     Cranberries   . Penicillins     Pt was told by father  NOT to take Penicillins.  . Sulfa Antibiotics   . Vioxx [Rofecoxib]     Chief Complaint  Patient presents with  . Medical Managment of Chronic Issues    6 month follow-up   . Amenorrhea    No menstural the month of September 2014, Spotting off/on this month of October     HPI: Patient is a 47 y.o. white female seen in the office today for medical mgt of chronic diseases including DMII, htn, iron deficiency anemia, GERD and hyperlipidemia.   Tells me that she did not have her period in sept and has had spotting off and on in the month of October.  Does admit to hot flashes a little bit Her mother passed in her 17s so had not gone through menopause so pattern unknown.  Says there is no way she could be pregnant.  Feels otherwise well without complaints.    Walking every night Thinks she is losing weight (actually gained a few lbs).  Review of Systems:  Review of Systems  Constitutional: Negative for fever, chills, weight loss and malaise/fatigue.  HENT: Negative for congestion and hearing loss.   Eyes: Negative for blurred vision.  Respiratory: Negative for shortness of breath.   Cardiovascular: Negative for chest pain and leg swelling.  Gastrointestinal: Negative for heartburn, abdominal pain, diarrhea, constipation, blood in stool and melena.  Genitourinary: Negative for dysuria.       Menses have become more irregular  Musculoskeletal: Negative for falls and myalgias.  Skin: Negative for rash.  Neurological: Negative for dizziness, loss of consciousness and weakness.  Endo/Heme/Allergies: Does not bruise/bleed easily.  Psychiatric/Behavioral: Negative for depression and memory  loss. The patient is not nervous/anxious.      Past Medical History  Diagnosis Date  . Hyperpotassemia   . Other abnormal blood chemistry   . Nontoxic uninodular goiter   . Essential hypertension, benign   . Routine general medical examination at a health care facility   . Anemia, unspecified   . Candidiasis of vulva and vagina   . Dysuria   . Anxiety state, unspecified   . Palpitations   . Shortness of breath   . Routine gynecological examination   . Esophageal reflux   . Acute frontal sinusitis   . Unspecified pruritic disorder   . Other and unspecified hyperlipidemia   . Other abnormal blood chemistry   . Neoplasm of uncertain behavior of skin   . Sprain of tibiofibular (ligament), distal of ankle   . Unspecified constipation   . Hypopotassemia   . Iron deficiency anemia, unspecified   . Unspecified essential hypertension   . Contact dermatitis and other eczema, due to unspecified cause   . Loss of weight   . Dysuria   . Edema   . Urinary tract infection, site not specified   . Lumbago   . Headache(784.0)   . Dysmenorrhea   . Dizziness and giddiness   . Abdominal pain, right lower quadrant   . Pure hypercholesterolemia   . External hemorrhoids without mention of complication   . Allergic rhinitis, cause unspecified   . Acute upper respiratory infections of unspecified  site   . Reflux esophagitis     History reviewed. No pertinent past surgical history.  Social History:   reports that she has never smoked. She does not have any smokeless tobacco history on file. She reports that she does not drink alcohol or use illicit drugs.  History reviewed. No pertinent family history.  Medications: Patient's Medications  New Prescriptions   No medications on file  Previous Medications   CINNAMON PO    Take 2 tablets by mouth daily.   DOCUSATE SODIUM (COLACE) 50 MG CAPSULE    Take by mouth at bedtime.   FISH OIL-OMEGA-3 FATTY ACIDS 1000 MG CAPSULE    Take by mouth  daily.   HYDROCHLOROTHIAZIDE (HYDRODIURIL) 25 MG TABLET    TAKE 1 TABLET BY MOUTH DAILY   POTASSIUM BICARBONATE (K-LYTE) 25 MEQ DISINTEGRATING TABLET    Take 25 mEq by mouth daily.   TRIGELS-F FORTE 460-60-0.01-1 MG CAPS CAPSULE    TAKE 1 CAPSULE BY MOUTH DAILY.  Modified Medications   No medications on file  Discontinued Medications   No medications on file     Physical Exam: Filed Vitals:   12/12/12 0828  BP: 128/70  Pulse: 71  Temp: 97.3 F (36.3 C)  TempSrc: Oral  Weight: 174 lb (78.926 kg)  SpO2: 98%  Physical Exam  Constitutional: She is oriented to person, place, and time. She appears well-developed and well-nourished. No distress.  HENT:  Head: Normocephalic and atraumatic.  Cardiovascular: Normal rate, regular rhythm, normal heart sounds and intact distal pulses.   No murmur heard. Pulmonary/Chest: Effort normal and breath sounds normal. No respiratory distress.  Abdominal: Soft. Bowel sounds are normal. She exhibits no distension and no mass. There is no tenderness. There is no rebound and no guarding. No hernia.  Musculoskeletal: Normal range of motion.  Neurological: She is alert and oriented to person, place, and time.  Skin: Skin is warm and dry.  Psychiatric: She has a normal mood and affect.     Labs reviewed: Basic Metabolic Panel:  Recent Labs  45/40/98 1604 06/09/12 1723  NA 139 141  K 3.2* 4.1  CL 102 100  CO2 26 28  GLUCOSE 87 88  BUN 12.9 16  CREATININE 0.8 0.76  CALCIUM 9.4 10.0  TSH  --  2.850   Liver Function Tests:  Recent Labs  06/09/12 1723  AST 24  ALT 37*  ALKPHOS 40  BILITOT 0.3  PROT 7.3   No results found for this basename: LIPASE, AMYLASE,  in the last 8760 hours No results found for this basename: AMMONIA,  in the last 8760 hours CBC:  Recent Labs  03/18/12 1604 04/17/12 1604 06/09/12 1723  WBC 7.9 6.6 8.7  NEUTROABS 4.2 3.3 5.0  HGB 10.7* 11.3* 11.1  HCT 33.5* 36.3 35.4  MCV 70.1* 71.7* 72*  PLT 304 350   --    Lipid Panel:  Recent Labs  06/09/12 1723  HDL 44  LDLCALC 139*  TRIG 106  CHOLHDL 4.6*   Lab Results  Component Value Date   HGBA1C 6.7* 06/09/2012   Assessment/Plan 1. Essential hypertension, benign -at goal with current therapy, continue walking nightly  2. Menopause -suspected -continue vitamin - Fe Fum-Vit C-Vit B12-FA (TRIGELS-F FORTE) 460-60-0.01-1 MG CAPS capsule; TAKE 1 CAPSULE BY MOUTH DAILY.  Dispense: 30 capsule; Refill: 5 -discussed that she can expect fewer regular periods and more hot flashes--since she is only 69, this could go on for a few years with average menopause  at 47 yo -advised that if she develops other problems like weight loss, abdominal pain, worsened bleeding, to let us know  3. Hyperglycemia -she expects this will be improved due to her walking nightly - f/u Hemoglobin A1c  4. Hyperlipidemia LDL goal < 100 -should be better since she is exercising - check Lipid panel  5. Anemia, iron deficiency -f/u h/h  - Fe Fum-Vit C-Vit B12-FA (TRIGELS-F FORTE) 460-60-0.01-1 MG CAPS capsule; TAKE 1 CAPSULE BY MOUTH DAILY.  Dispense: 30 capsule; Refill: 5 - CBC with Differential  Labs/tests ordered:  Cbc, hba1c, flp Next appt:  6 mos

## 2012-12-13 DIAGNOSIS — E785 Hyperlipidemia, unspecified: Secondary | ICD-10-CM | POA: Insufficient documentation

## 2012-12-13 DIAGNOSIS — I10 Essential (primary) hypertension: Secondary | ICD-10-CM | POA: Insufficient documentation

## 2012-12-13 DIAGNOSIS — Z780 Asymptomatic menopausal state: Secondary | ICD-10-CM | POA: Insufficient documentation

## 2012-12-13 DIAGNOSIS — D509 Iron deficiency anemia, unspecified: Secondary | ICD-10-CM | POA: Insufficient documentation

## 2012-12-13 DIAGNOSIS — R739 Hyperglycemia, unspecified: Secondary | ICD-10-CM | POA: Insufficient documentation

## 2012-12-13 LAB — CBC WITH DIFFERENTIAL/PLATELET
Basophils Absolute: 0.1 10*3/uL (ref 0.0–0.2)
Basos: 1 %
Eos: 3 %
Eosinophils Absolute: 0.2 10*3/uL (ref 0.0–0.4)
HCT: 38.9 % (ref 34.0–46.6)
Hemoglobin: 12.7 g/dL (ref 11.1–15.9)
Immature Grans (Abs): 0 10*3/uL (ref 0.0–0.1)
Immature Granulocytes: 0 %
Lymphocytes Absolute: 1.8 10*3/uL (ref 0.7–3.1)
Lymphs: 31 %
MCH: 24 pg — ABNORMAL LOW (ref 26.6–33.0)
MCHC: 32.6 g/dL (ref 31.5–35.7)
MCV: 73 fL — ABNORMAL LOW (ref 79–97)
Monocytes Absolute: 0.5 10*3/uL (ref 0.1–0.9)
Monocytes: 8 %
Neutrophils Absolute: 3.3 10*3/uL (ref 1.4–7.0)
Neutrophils Relative %: 57 %
RBC: 5.3 x10E6/uL — ABNORMAL HIGH (ref 3.77–5.28)
RDW: 16.4 % — ABNORMAL HIGH (ref 12.3–15.4)
WBC: 5.9 10*3/uL (ref 3.4–10.8)

## 2012-12-13 LAB — LIPID PANEL
Chol/HDL Ratio: 5.2 ratio units — ABNORMAL HIGH (ref 0.0–4.4)
Cholesterol, Total: 220 mg/dL — ABNORMAL HIGH (ref 100–199)
HDL: 42 mg/dL (ref >39)
LDL Calculated: 145 mg/dL — ABNORMAL HIGH (ref 0–99)
Triglycerides: 164 mg/dL — ABNORMAL HIGH (ref 0–149)
VLDL Cholesterol Cal: 33 mg/dL (ref 5–40)

## 2012-12-13 LAB — HEMOGLOBIN A1C
Est. average glucose Bld gHb Est-mCnc: 160 mg/dL
Hgb A1c MFr Bld: 7.2 % — ABNORMAL HIGH (ref 4.8–5.6)

## 2012-12-13 LAB — BASIC METABOLIC PANEL
BUN/Creatinine Ratio: 20 (ref 9–23)
BUN: 15 mg/dL (ref 6–24)
CO2: 25 mmol/L (ref 18–29)
Calcium: 10.5 mg/dL — ABNORMAL HIGH (ref 8.7–10.2)
Chloride: 99 mmol/L (ref 97–108)
Creatinine, Ser: 0.75 mg/dL (ref 0.57–1.00)
GFR calc Af Amer: 108 mL/min/{1.73_m2} (ref >59)
GFR calc non Af Amer: 94 mL/min/{1.73_m2} (ref >59)
Glucose: 119 mg/dL — ABNORMAL HIGH (ref 65–99)
Potassium: 4.5 mmol/L (ref 3.5–5.2)
Sodium: 141 mmol/L (ref 134–144)

## 2012-12-18 ENCOUNTER — Other Ambulatory Visit: Payer: Self-pay | Admitting: *Deleted

## 2012-12-18 ENCOUNTER — Encounter: Payer: Self-pay | Admitting: *Deleted

## 2012-12-18 MED ORDER — METFORMIN HCL 500 MG PO TABS: 500.0000 mg | ORAL_TABLET | Freq: Every day | ORAL | Status: DC

## 2012-12-18 NOTE — Telephone Encounter (Signed)
rx sent to pharmacy through electronically

## 2012-12-25 ENCOUNTER — Other Ambulatory Visit: Payer: Self-pay | Admitting: *Deleted

## 2012-12-25 MED ORDER — METFORMIN HCL 500 MG PO TABS: ORAL_TABLET | ORAL | Status: DC

## 2013-02-07 ENCOUNTER — Other Ambulatory Visit: Payer: Self-pay | Admitting: Internal Medicine

## 2013-03-26 ENCOUNTER — Other Ambulatory Visit: Payer: Self-pay | Admitting: *Deleted

## 2013-03-26 MED ORDER — POTASSIUM BICARBONATE 25 MEQ PO TBEF: 25.0000 meq | EFFERVESCENT_TABLET | Freq: Every day | ORAL | Status: DC

## 2013-04-18 ENCOUNTER — Telehealth: Payer: Self-pay | Admitting: *Deleted

## 2013-04-18 ENCOUNTER — Encounter: Payer: Self-pay | Admitting: *Deleted

## 2013-04-18 NOTE — Telephone Encounter (Signed)
Former pt of Dr. Darnell Level, Letter printed

## 2013-06-05 ENCOUNTER — Ambulatory Visit (INDEPENDENT_AMBULATORY_CARE_PROVIDER_SITE_OTHER): Payer: BC Managed Care – PPO | Admitting: Internal Medicine

## 2013-06-05 ENCOUNTER — Encounter: Payer: Self-pay | Admitting: Internal Medicine

## 2013-06-05 VITALS — BP 118/74 | HR 65 | Temp 98.1°F | Resp 16 | Ht 67.0 in | Wt 175.0 lb

## 2013-06-05 DIAGNOSIS — Z0000 Encounter for general adult medical examination without abnormal findings: Secondary | ICD-10-CM

## 2013-06-05 DIAGNOSIS — E785 Hyperlipidemia, unspecified: Secondary | ICD-10-CM

## 2013-06-05 DIAGNOSIS — M545 Low back pain, unspecified: Secondary | ICD-10-CM | POA: Insufficient documentation

## 2013-06-05 DIAGNOSIS — Z780 Asymptomatic menopausal state: Secondary | ICD-10-CM

## 2013-06-05 DIAGNOSIS — D509 Iron deficiency anemia, unspecified: Secondary | ICD-10-CM

## 2013-06-05 DIAGNOSIS — N951 Menopausal and female climacteric states: Secondary | ICD-10-CM

## 2013-06-05 DIAGNOSIS — I10 Essential (primary) hypertension: Secondary | ICD-10-CM

## 2013-06-05 DIAGNOSIS — E119 Type 2 diabetes mellitus without complications: Secondary | ICD-10-CM | POA: Insufficient documentation
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
## 2013-06-06 LAB — CBC WITH DIFFERENTIAL
Basophils Absolute: 0 10*3/uL (ref 0.0–0.2)
Basos: 1 %
Eos: 2 %
Eosinophils Absolute: 0.1 10*3/uL (ref 0.0–0.4)
HCT: 38.2 % (ref 34.0–46.6)
Hemoglobin: 12 g/dL (ref 11.1–15.9)
Immature Grans (Abs): 0 10*3/uL (ref 0.0–0.1)
Immature Granulocytes: 0 %
Lymphocytes Absolute: 2.3 10*3/uL (ref 0.7–3.1)
Lymphs: 31 %
MCH: 23.2 pg — ABNORMAL LOW (ref 26.6–33.0)
MCHC: 31.4 g/dL — ABNORMAL LOW (ref 31.5–35.7)
MCV: 74 fL — ABNORMAL LOW (ref 79–97)
Monocytes Absolute: 0.7 10*3/uL (ref 0.1–0.9)
Monocytes: 9 %
Neutrophils Absolute: 4.2 10*3/uL (ref 1.4–7.0)
Neutrophils Relative %: 57 %
Platelets: 333 10*3/uL (ref 150–379)
RBC: 5.18 x10E6/uL (ref 3.77–5.28)
RDW: 17 % — ABNORMAL HIGH (ref 12.3–15.4)
WBC: 7.3 10*3/uL (ref 3.4–10.8)

## 2013-06-06 LAB — COMPREHENSIVE METABOLIC PANEL
ALT: 55 IU/L — ABNORMAL HIGH (ref 0–32)
AST: 42 IU/L — ABNORMAL HIGH (ref 0–40)
Albumin/Globulin Ratio: 2 (ref 1.1–2.5)
Albumin: 4.9 g/dL (ref 3.5–5.5)
Alkaline Phosphatase: 48 IU/L (ref 39–117)
BUN/Creatinine Ratio: 19 (ref 9–23)
BUN: 13 mg/dL (ref 6–24)
CO2: 24 mmol/L (ref 18–29)
Calcium: 9.5 mg/dL (ref 8.7–10.2)
Chloride: 99 mmol/L (ref 97–108)
Creatinine, Ser: 0.7 mg/dL (ref 0.57–1.00)
GFR calc Af Amer: 118 mL/min/{1.73_m2} (ref >59)
GFR calc non Af Amer: 102 mL/min/{1.73_m2} (ref >59)
Globulin, Total: 2.4 g/dL (ref 1.5–4.5)
Glucose: 110 mg/dL — ABNORMAL HIGH (ref 65–99)
Potassium: 4.3 mmol/L (ref 3.5–5.2)
Sodium: 141 mmol/L (ref 134–144)
Total Bilirubin: 0.5 mg/dL (ref 0.0–1.2)
Total Protein: 7.3 g/dL (ref 6.0–8.5)

## 2013-06-06 LAB — LIPID PANEL
Chol/HDL Ratio: 4.9 ratio units — ABNORMAL HIGH (ref 0.0–4.4)
Cholesterol, Total: 212 mg/dL — ABNORMAL HIGH (ref 100–199)
HDL: 43 mg/dL (ref >39)
LDL Calculated: 138 mg/dL — ABNORMAL HIGH (ref 0–99)
Triglycerides: 155 mg/dL — ABNORMAL HIGH (ref 0–149)
VLDL Cholesterol Cal: 31 mg/dL (ref 5–40)

## 2013-06-06 LAB — HEMOGLOBIN A1C
Est. average glucose Bld gHb Est-mCnc: 157 mg/dL
Hgb A1c MFr Bld: 7.1 % — ABNORMAL HIGH (ref 4.8–5.6)

## 2013-06-06 LAB — MICROALBUMIN / CREATININE URINE RATIO
Creatinine, Ur: 135.5 mg/dL (ref 15.0–278.0)
MICROALB/CREAT RATIO: 12.2 mg/g creat (ref 0.0–30.0)
Microalbumin, Urine: 16.5 ug/mL (ref 0.0–17.0)

## 2013-06-11 ENCOUNTER — Other Ambulatory Visit: Payer: Self-pay | Admitting: *Deleted

## 2013-06-11 DIAGNOSIS — R945 Abnormal results of liver function studies: Secondary | ICD-10-CM

## 2013-06-11 MED ORDER — METFORMIN HCL 500 MG PO TABS: ORAL_TABLET | ORAL | Status: DC

## 2013-06-11 MED ORDER — ATORVASTATIN CALCIUM 10 MG PO TABS: ORAL_TABLET | ORAL | Status: DC

## 2013-06-11 NOTE — Progress Notes (Signed)
Patient Notified and faxed Rx into pharmacy and scheduled lab appointment for 06/18/2013

## 2013-06-18 ENCOUNTER — Other Ambulatory Visit: Payer: BC Managed Care – PPO

## 2013-06-18 DIAGNOSIS — R945 Abnormal results of liver function studies: Secondary | ICD-10-CM

## 2013-06-19 LAB — HEPATITIS PANEL, ACUTE
Hep A IgM: NEGATIVE
Hep B C IgM: NEGATIVE
Hep C Virus Ab: <0.1 s/co ratio (ref 0.0–0.9)
Hepatitis B Surface Ag: NEGATIVE

## 2013-08-12 ENCOUNTER — Other Ambulatory Visit: Payer: Self-pay | Admitting: Internal Medicine

## 2013-10-16 ENCOUNTER — Ambulatory Visit: Payer: BC Managed Care – PPO | Admitting: Internal Medicine

## 2013-10-19 ENCOUNTER — Other Ambulatory Visit: Payer: Self-pay | Admitting: Nurse Practitioner

## 2013-10-23 ENCOUNTER — Encounter: Payer: Self-pay | Admitting: Internal Medicine

## 2013-10-23 ENCOUNTER — Ambulatory Visit (INDEPENDENT_AMBULATORY_CARE_PROVIDER_SITE_OTHER): Payer: BC Managed Care – PPO | Admitting: Internal Medicine

## 2013-10-23 VITALS — BP 110/74 | HR 66 | Temp 97.7°F | Resp 18 | Ht 67.0 in | Wt 173.8 lb

## 2013-10-23 DIAGNOSIS — M545 Low back pain, unspecified: Secondary | ICD-10-CM

## 2013-10-23 DIAGNOSIS — E119 Type 2 diabetes mellitus without complications: Secondary | ICD-10-CM

## 2013-10-23 DIAGNOSIS — E1169 Type 2 diabetes mellitus with other specified complication: Secondary | ICD-10-CM | POA: Insufficient documentation

## 2013-10-23 DIAGNOSIS — E785 Hyperlipidemia, unspecified: Secondary | ICD-10-CM

## 2013-10-23 DIAGNOSIS — I10 Essential (primary) hypertension: Secondary | ICD-10-CM

## 2013-10-23 DIAGNOSIS — D509 Iron deficiency anemia, unspecified: Secondary | ICD-10-CM

## 2013-10-23 NOTE — Progress Notes (Signed)
Patient ID: Joyce Richard, female   DOB: 25-Jan-1966, 48 y.o.   MRN: [id number]   Location:  [facility]  Code Status: full code  Allergies  Allergen Reactions  . Crestor [Rosuvastatin]   . Other     Cranberries   . Penicillins     Pt was told by father  NOT to take Penicillins.  . Sulfa Antibiotics   . Vioxx [Rofecoxib]     Chief Complaint  Patient presents with  . Medical Management of Chronic Issues    HPI: Patient is a 48 y.o. white female seen in the office today for f/u of her medical conditions.    Not tolerating metformin for her diabetes--says she had heart palpitations when she took it.  Stopped the metformin a month ago.  Back not hurting much unless she lifts heavy things at work. 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   Had a mole on her back removed and bled more than expected.  Never had a problem before taking the fish oil.    Saw gyn in June/july and hgb had gone down to 10.5.  Saw Dr. Beryle Beams in 2/14, but did not follow up b/c her husband had his brain tumor.  No longer works in [employer] area at work, now in Proof[employer].    Review of Systems:  Review of Systems  Constitutional: Negative for fever and malaise/fatigue.  HENT: Negative for congestion.   Eyes: Negative for blurred vision.  Respiratory: Negative for shortness of breath.   Cardiovascular: Positive for palpitations. Negative for chest pain.  Gastrointestinal: Negative for constipation, blood in stool and melena.  Genitourinary: Negative for dysuria, urgency and frequency.  Musculoskeletal: Positive for back pain. Negative for falls.  Skin: Negative for rash.  Neurological: Negative for dizziness and loss of consciousness.  Endo/Heme/Allergies: Bruises/bleeds easily.       Must take her iron or does get dizzy  Psychiatric/Behavioral: Negative for depression and memory loss.    Past Medical History  Diagnosis Date  .  Hyperpotassemia   . Other abnormal blood chemistry   . Nontoxic uninodular goiter   . Essential hypertension, benign   . Routine general medical examination at a health care facility   . Anemia, unspecified   . Candidiasis of vulva and vagina   . Dysuria   . Anxiety state, unspecified   . Palpitations   . Shortness of breath   . Routine gynecological examination   . Esophageal reflux   . Acute frontal sinusitis   . Unspecified pruritic disorder   . Other and unspecified hyperlipidemia   . Other abnormal blood chemistry   . Neoplasm of uncertain behavior of skin   . Sprain of tibiofibular (ligament), distal of ankle   . Unspecified constipation   . Hypopotassemia   . Iron deficiency anemia, unspecified   . Unspecified essential hypertension   . Contact dermatitis and other eczema, due to unspecified cause   . Loss of weight   . Dysuria   . Edema   . Urinary tract infection, site not specified   . Lumbago   . Headache(784.0)   . Dysmenorrhea   . Dizziness and giddiness   . Abdominal pain, right lower quadrant   . Pure hypercholesterolemia   . External hemorrhoids without mention of complication   . Allergic rhinitis, cause unspecified   . Acute upper respiratory infections of unspecified site   . Reflux esophagitis  No past surgical history on file.  Social History:   reports that she has never smoked. She does not have any smokeless tobacco history on file. She reports that she does not drink alcohol or use illicit drugs.  No family history on file.  Medications: Patient's Medications  New Prescriptions   No medications on file  Previous Medications   ATORVASTATIN (LIPITOR) 10 MG TABLET    Take one tablet by mouth every evening with supper to control cholesterol   CINNAMON PO    Take 2 tablets by mouth daily.   DOCUSATE SODIUM (COLACE) 50 MG CAPSULE    Take by mouth at bedtime. For constipation   FE FUM-VIT C-VIT B12-FA (TRIGELS-F FORTE) 460-60-0.01-1 MG CAPS  CAPSULE    TAKE 1 CAPSULE BY MOUTH DAILY.   FISH OIL-OMEGA-3 FATTY ACIDS 1000 MG CAPSULE    Take by mouth daily.   HYDROCHLOROTHIAZIDE (HYDRODIURIL) 25 MG TABLET    TAKE 1 TABLET BY MOUTH DAILY   KLOR-CON/EF 25 MEQ DISINTEGRATING TABLET    ALLOW 1 TABLET TO DISSOLVE COMPLETELY IN 4 OZ WATER BEFORE DRINKING ONCE A DAY   METFORMIN (GLUCOPHAGE) 500 MG TABLET    Take one tablet by mouth in the morning with food to control blood sugar  Modified Medications   No medications on file  Discontinued Medications   HYDROCHLOROTHIAZIDE (HYDRODIURIL) 25 MG TABLET    TAKE 1 TABLET BY MOUTH DAILY for Hyperlipidemia     Physical Exam: Filed Vitals:   10/23/13 0813  BP: 110/74  Pulse: 66  Temp: 97.7 F (36.5 C)  TempSrc: Oral  Resp: 18  Height: 5\' 7"  (1.702 m)  Weight: 173 lb 12.8 oz (78.835 kg)  SpO2: 98%  Physical Exam  Constitutional: She is oriented to person, place, and time. She appears well-developed and well-nourished. No distress.  HENT:  Head: Normocephalic and atraumatic.  Cardiovascular: Normal rate, regular rhythm, normal heart sounds and intact distal pulses.   Pulmonary/Chest: Effort normal and breath sounds normal. No respiratory distress.  Abdominal: Soft. Bowel sounds are normal. She exhibits no distension and no mass. There is no tenderness.  Musculoskeletal: Normal range of motion. She exhibits no edema and no tenderness.  Neurological: She is alert and oriented to person, place, and time.  Skin: Skin is warm and dry.  Psychiatric: She has a normal mood and affect.     Labs reviewed: Basic Metabolic Panel:  Recent Labs  12/12/12 0901 06/05/13 1000  NA 141 141  K 4.5 4.3  CL 99 99  CO2 25 24  GLUCOSE 119* 110*  BUN 15 13  CREATININE 0.75 0.70  CALCIUM 10.5* 9.5   Liver Function Tests:  Recent Labs  06/05/13 1000  AST 42*  ALT 55*  ALKPHOS 48  BILITOT 0.5  PROT 7.3  CBC:  Recent Labs  12/12/12 0901 06/05/13 1000  WBC 5.9 7.3  NEUTROABS 3.3 4.2    HGB 12.7 12.0  HCT 38.9 38.2  MCV 73* 74*  PLT  --  333   Lipid Panel:  Recent Labs  12/12/12 0901 06/05/13 1000  HDL 42 43  LDLCALC 145* 138*  TRIG 164* 155*  CHOLHDL 5.2* 4.9*   Lab Results  Component Value Date   HGBA1C 7.1* 06/05/2013   Assessment/Plan 1. Type II or unspecified type diabetes mellitus without mention of complication, not stated as uncontrolled - has not tolerated metformin due to palpitations - she has modified her diet significantly, she tells me to cut back on  sweets and starches -Basic metabolic panel - Hemoglobin A1c  2. Hyperlipidemia LDL goal <100 - continues her lipitor low dose (10mg ), cinnamon and fish oil - Lipid panel  3. Essential hypertension, benign -bp at goal with hctz only, will need urine microalbumin next time - Basic metabolic panel  4. Anemia, iron deficiency - needs to f/u with hematology as Dr. Beryle Beams was concerned about myelodysplastic syndrome or possible occupational chemical exposure - CBC With differential/Platelet  5. Midline low back pain without sciatica -not problematic recently--is leaving heavy lifting to the men at work and doesn't otherwise cause her problems  Labs/tests ordered:   Orders Placed This Encounter  Procedures  . Basic metabolic panel  . Hemoglobin A1c  . Lipid panel  . CBC With differential/Platelet    Next appt:  4 mos with labs day of visit; will plan on referral for cscope and also urine microalbumin

## 2013-10-23 NOTE — Patient Instructions (Signed)
Please follow up with hematology about your anemia.  Dr. Beryle Beams had some concerns about your bloodwork and wanted to further investigate last year.

## 2013-10-24 LAB — LIPID PANEL
Chol/HDL Ratio: 5.4 ratio units — ABNORMAL HIGH (ref 0.0–4.4)
Cholesterol, Total: 214 mg/dL — ABNORMAL HIGH (ref 100–199)
HDL: 40 mg/dL (ref >39)
LDL Calculated: 132 mg/dL — ABNORMAL HIGH (ref 0–99)
Triglycerides: 211 mg/dL — ABNORMAL HIGH (ref 0–149)
VLDL Cholesterol Cal: 42 mg/dL — ABNORMAL HIGH (ref 5–40)

## 2013-10-24 LAB — CBC WITH DIFFERENTIAL
Basophils Absolute: 0.1 10*3/uL (ref 0.0–0.2)
Basos: 1 %
Eos: 3 %
Eosinophils Absolute: 0.2 10*3/uL (ref 0.0–0.4)
HCT: 35.4 % (ref 34.0–46.6)
Hemoglobin: 11.4 g/dL (ref 11.1–15.9)
Immature Grans (Abs): 0 10*3/uL (ref 0.0–0.1)
Immature Granulocytes: 0 %
Lymphocytes Absolute: 2.2 10*3/uL (ref 0.7–3.1)
Lymphs: 35 %
MCH: 22.6 pg — ABNORMAL LOW (ref 26.6–33.0)
MCHC: 32.2 g/dL (ref 31.5–35.7)
MCV: 70 fL — ABNORMAL LOW (ref 79–97)
Monocytes Absolute: 0.5 10*3/uL (ref 0.1–0.9)
Monocytes: 8 %
Neutrophils Absolute: 3.3 10*3/uL (ref 1.4–7.0)
Neutrophils Relative %: 53 %
Platelets: 336 10*3/uL (ref 150–379)
RBC: 5.05 x10E6/uL (ref 3.77–5.28)
RDW: 17.4 % — ABNORMAL HIGH (ref 12.3–15.4)
WBC: 6.2 10*3/uL (ref 3.4–10.8)

## 2013-10-24 LAB — BASIC METABOLIC PANEL
BUN/Creatinine Ratio: 19 (ref 9–23)
BUN: 13 mg/dL (ref 6–24)
CO2: 24 mmol/L (ref 18–29)
Calcium: 10 mg/dL (ref 8.7–10.2)
Chloride: 98 mmol/L (ref 97–108)
Creatinine, Ser: 0.7 mg/dL (ref 0.57–1.00)
GFR calc Af Amer: 117 mL/min/{1.73_m2} (ref >59)
GFR calc non Af Amer: 101 mL/min/{1.73_m2} (ref >59)
Glucose: 120 mg/dL — ABNORMAL HIGH (ref 65–99)
Potassium: 4 mmol/L (ref 3.5–5.2)
Sodium: 141 mmol/L (ref 134–144)

## 2013-10-24 LAB — HEMOGLOBIN A1C
Est. average glucose Bld gHb Est-mCnc: 154 mg/dL
Hgb A1c MFr Bld: 7 % — ABNORMAL HIGH (ref 4.8–5.6)

## 2013-10-29 ENCOUNTER — Telehealth: Payer: Self-pay

## 2013-10-29 MED ORDER — ATORVASTATIN CALCIUM 40 MG PO TABS: 40.0000 mg | ORAL_TABLET | Freq: Every day | ORAL | Status: DC

## 2013-10-29 MED ORDER — LINAGLIPTIN 5 MG PO TABS: 5.0000 mg | ORAL_TABLET | Freq: Every day | ORAL | Status: DC

## 2013-10-29 NOTE — Telephone Encounter (Signed)
Discussed with patient, verbalized understanding of results. RX's sent to pharmacy, copy of report mailed to patient.

## 2013-10-29 NOTE — Telephone Encounter (Signed)
Message copied by Logan Bores on Thu Oct 29, 2013  4:42 PM ------      Message from: Gayland Curry      Created: Sat Oct 24, 2013 10:25 AM       Sugar average is not much different.  Let's try a different medication for it:  tradjenta 5mg  daily with her largest meal.  This should not cause any low sugars or side effects to speak of.      For some reason triglycerides (starchy fats) actually went up.  Bad cholesterol is also still elevated.  Increase the lipitor to 40mg  with her evening meal.  It would also be good if she takes 2000mg  of fish oil instead of 1000mg  to try to increase her good cholesterol.        She needs to follow up with hematology--this is very important. ------

## 2013-11-16 ENCOUNTER — Other Ambulatory Visit: Payer: Self-pay | Admitting: Internal Medicine

## 2013-11-23 LAB — HM DIABETES EYE EXAM

## 2014-02-08 ENCOUNTER — Other Ambulatory Visit: Payer: Self-pay | Admitting: Internal Medicine

## 2014-02-11 ENCOUNTER — Other Ambulatory Visit: Payer: Self-pay | Admitting: Internal Medicine

## 2014-02-22 ENCOUNTER — Ambulatory Visit: Payer: BC Managed Care – PPO | Admitting: Internal Medicine

## 2014-02-23 ENCOUNTER — Encounter: Payer: Self-pay | Admitting: Internal Medicine

## 2014-02-23 ENCOUNTER — Ambulatory Visit (INDEPENDENT_AMBULATORY_CARE_PROVIDER_SITE_OTHER): Payer: BLUE CROSS/BLUE SHIELD | Admitting: Internal Medicine

## 2014-02-23 VITALS — BP 120/84 | HR 68 | Temp 97.9°F | Wt 170.0 lb

## 2014-02-23 DIAGNOSIS — A6920 Lyme disease, unspecified: Secondary | ICD-10-CM

## 2014-02-23 DIAGNOSIS — W57XXXA Bitten or stung by nonvenomous insect and other nonvenomous arthropods, initial encounter: Secondary | ICD-10-CM

## 2014-02-23 DIAGNOSIS — S30860A Insect bite (nonvenomous) of lower back and pelvis, initial encounter: Secondary | ICD-10-CM

## 2014-02-23 MED ORDER — DOXYCYCLINE HYCLATE 100 MG PO TABS: 100.0000 mg | ORAL_TABLET | Freq: Two times a day (BID) | ORAL | Status: DC

## 2014-02-23 NOTE — Progress Notes (Signed)
Patient ID: Joyce Richard, female   DOB: 02/21/1965, 49 y.o.   MRN: [id number]    Chief Complaint  Patient presents with  . tick bite    found the tick on her this morning left hip   Allergies  Allergen Reactions  . Metformin And Related Palpitations  . Crestor [Rosuvastatin]   . Other     Cranberries   . Penicillins     Pt was told by father  NOT to take Penicillins.  . Sulfa Antibiotics   . Vioxx [Rofecoxib]    HPI 49 y/o female pt is here for AV. She found a tick on her left hip area this morning. She removed it and has it in a pill bottle for [place] to see. She noticed redness in left hip area. She complaints of malaise but denies any fever or chills.   ROS Denies any headache Denies nausea, vomiting Denies neck pain Denies numbness/ tingling. Denies focal weakness. Denies chest pain or dyspnea No rash or lesion elsewhere Appetite is good No URI symptoms No falls reported No change of vision  Past Medical History  Diagnosis Date  . Hyperpotassemia   . Other abnormal blood chemistry   . Nontoxic uninodular goiter   . Essential hypertension, benign   . Routine general medical examination at a health care facility   . Anemia, unspecified   . Candidiasis of vulva and vagina   . Dysuria   . Anxiety state, unspecified   . Palpitations   . Shortness of breath   . Routine gynecological examination   . Esophageal reflux   . Acute frontal sinusitis   . Unspecified pruritic disorder   . Other and unspecified hyperlipidemia   . Other abnormal blood chemistry   . Neoplasm of uncertain behavior of skin   . Sprain of tibiofibular (ligament), distal of ankle   . Unspecified constipation   . Hypopotassemia   . Iron deficiency anemia, unspecified   . Unspecified essential hypertension   . Contact dermatitis and other eczema, due to unspecified cause   . Loss of weight   . Dysuria   . Edema   . Urinary tract infection, site not specified   . Lumbago   . Headache(784.0)    . Dysmenorrhea   . Dizziness and giddiness   . Abdominal pain, right lower quadrant   . Pure hypercholesterolemia   . External hemorrhoids without mention of complication   . Allergic rhinitis, cause unspecified   . Acute upper respiratory infections of unspecified site   . Reflux esophagitis    Current Outpatient Prescriptions on File Prior to Visit  Medication Sig Dispense Refill  . atorvastatin (LIPITOR) 40 MG tablet Take 1 tablet (40 mg total) by mouth daily. 30 tablet 3  . CINNAMON PO Take 2 tablets by mouth daily.    Marland Kitchen docusate sodium (COLACE) 50 MG capsule Take by mouth at bedtime. For constipation    . fish oil-omega-3 fatty acids 1000 MG capsule Take by mouth daily.    . hydrochlorothiazide (HYDRODIURIL) 25 MG tablet TAKE 1 TABLET BY MOUTH DAILY 30 tablet 5  . KLOR-CON/EF 25 MEQ disintegrating tablet ALLOW 1 TABLET TO DISSOLVE COMPLETELY IN 4 OZ WATER BEFORE DRINKING ONCE A DAY 30 tablet 5  . linagliptin (TRADJENTA) 5 MG TABS tablet Take 1 tablet (5 mg total) by mouth daily. 30 tablet 3  . TRIGELS-F FORTE 460-60-0.01-1 MG CAPS capsule TAKE ONE CAPSULE EVERY DAY 30 capsule 5   No current facility-administered medications on  file prior to visit.    Physical exam BP 120/84 mmHg  Pulse 68  Temp(Src) 97.9 F (36.6 C) (Oral)  Wt 170 lb (77.111 kg)  General- adult female in no acute distress Head- atraumatic, normocephalic Eyes- no pallor, no icterus Neck- no lymphadenopathy Skin- erythema with raised area on left buttock/hip area. Has a bite mark, no drainage, has a target type lesion Cardiovascular- normal s1,s2, no murmurs Respiratory- bilateral clear to auscultation, no wheeze, no rhonchi, no crackles Abdomen- bowel sounds present, soft, non tender Musculoskeletal- able to move all 4 extremities, no leg edema Neurological- no focal deficit, normal reflexes, normal muscle strength, normal sensation to fine touch and vibration Psychiatry- alert and oriented to person,  place and time, normal mood and affect  Assessment/plan  1. Tick bite of buttock, initial encounter Tick has been removed by patient and the tick is engorged. Erythema migrans lesion noted. No neurological or cardiac involvement symptoms noted at present. Given her lesion and the tick being engorgerd, will treat her prophylactically with doxycycline 100 mg bid for 2 weeks. Advised pt about warning signs to look out for involving cns and cardiovascular region. Pt to take a cup of yoghurt for probiotic effect while on antibiotics  2. Erythema migrans (Lyme disease) See above. No other rash/ lesion. Monitor clinically.

## 2014-03-08 ENCOUNTER — Ambulatory Visit: Payer: Self-pay | Admitting: Internal Medicine

## 2014-03-12 ENCOUNTER — Ambulatory Visit: Payer: Self-pay | Admitting: Internal Medicine

## 2014-03-29 ENCOUNTER — Ambulatory Visit: Payer: Self-pay | Admitting: Internal Medicine

## 2014-04-15 ENCOUNTER — Encounter: Payer: Self-pay | Admitting: Internal Medicine

## 2014-04-15 ENCOUNTER — Ambulatory Visit (INDEPENDENT_AMBULATORY_CARE_PROVIDER_SITE_OTHER): Payer: BLUE CROSS/BLUE SHIELD | Admitting: Internal Medicine

## 2014-04-15 VITALS — BP 120/80 | HR 57 | Temp 97.6°F | Resp 18 | Ht 67.0 in | Wt 171.8 lb

## 2014-04-15 DIAGNOSIS — D509 Iron deficiency anemia, unspecified: Secondary | ICD-10-CM

## 2014-04-15 DIAGNOSIS — E785 Hyperlipidemia, unspecified: Secondary | ICD-10-CM

## 2014-04-15 DIAGNOSIS — E119 Type 2 diabetes mellitus without complications: Secondary | ICD-10-CM

## 2014-04-15 DIAGNOSIS — I10 Essential (primary) hypertension: Secondary | ICD-10-CM

## 2014-04-15 DIAGNOSIS — R002 Palpitations: Secondary | ICD-10-CM

## 2014-04-15 MED ORDER — ASPIRIN 81 MG PO TBEC: 81.0000 mg | DELAYED_RELEASE_TABLET | Freq: Every day | ORAL | Status: AC

## 2014-04-15 NOTE — Progress Notes (Signed)
Patient ID: Joyce Richard, female   DOB: [birth year], 49 y.o.   MRN: [id number]   Location:  [facility]  Code Status: full code; has not yet done living will and hcpoa  Allergies  Allergen Reactions  . Metformin And Related Palpitations  . Crestor [Rosuvastatin]   . Other     Cranberries   . Penicillins     Pt was told by father  NOT to take Penicillins.  . Sulfa Antibiotics   . Vioxx [Rofecoxib]     Chief Complaint  Patient presents with  . Medical Management of Chronic Issues    HPI: Patient is a 49 y.o. white female seen in the office today for med mgt of chronic diseases.    Stopped her potassium b/c of normal levels.  Back to having cramps.  Has been having some fluttering of her heart off and on.  A few seconds to mins and it goes away.  Seems irregular.  Also occurs with caffeine which she avoids.   Admits to more stress at work lately.  Had previously and EKG sinus rhythm at 56 in 4/15.   Refuses prevnar for now despite recommendation.  Review of Systems:  Review of Systems  Constitutional: Negative for fever and chills.  HENT: Negative for congestion.   Eyes: Negative for blurred vision.  Respiratory: Negative for shortness of breath.   Cardiovascular: Positive for palpitations. Negative for chest pain and leg swelling.  Gastrointestinal: Negative for abdominal pain and constipation.  Genitourinary: Negative for dysuria.  Musculoskeletal: Negative for falls.  Skin: Negative for rash.  Neurological: Negative for dizziness.  Psychiatric/Behavioral: Negative for depression and memory loss.       Does have stress with caring for her husband and with changes at work     Past Medical History  Diagnosis Date  . Hyperpotassemia   . Other abnormal blood chemistry   . Nontoxic uninodular goiter   . Essential hypertension, benign   . Routine general medical examination at a health care facility   . Anemia, unspecified     . Candidiasis of vulva and vagina   . Dysuria   . Anxiety state, unspecified   . Palpitations   . Shortness of breath   . Routine gynecological examination   . Esophageal reflux   . Acute frontal sinusitis   . Unspecified pruritic disorder   . Other and unspecified hyperlipidemia   . Other abnormal blood chemistry   . Neoplasm of uncertain behavior of skin   . Sprain of tibiofibular (ligament), distal of ankle   . Unspecified constipation   . Hypopotassemia   . Iron deficiency anemia, unspecified   . Unspecified essential hypertension   . Contact dermatitis and other eczema, due to unspecified cause   . Loss of weight   . Dysuria   . Edema   . Urinary tract infection, site not specified   . Lumbago   . Headache(784.0)   . Dysmenorrhea   . Dizziness and giddiness   . Abdominal pain, right lower quadrant   . Pure hypercholesterolemia   . External hemorrhoids without mention of complication   . Allergic rhinitis, cause unspecified   . Acute upper respiratory infections of unspecified site   . Reflux esophagitis     History reviewed. No pertinent past surgical history.  Social History:   reports that she has never smoked. She has never used smokeless tobacco. She reports that she does not drink alcohol or use  illicit drugs.  History reviewed. No pertinent family history.  Medications: Patient's Medications  New Prescriptions   No medications on file  Previous Medications   ATORVASTATIN (LIPITOR) 40 MG TABLET    Take 1 tablet (40 mg total) by mouth daily.   CINNAMON PO    Take 2 tablets by mouth daily.   DOCUSATE SODIUM (COLACE) 50 MG CAPSULE    Take by mouth at bedtime. For constipation   DOXYCYCLINE (VIBRA-TABS) 100 MG TABLET    Take 1 tablet (100 mg total) by mouth 2 (two) times daily.   FISH OIL-OMEGA-3 FATTY ACIDS 1000 MG CAPSULE    Take by mouth daily.   HYDROCHLOROTHIAZIDE (HYDRODIURIL) 25 MG TABLET    TAKE 1 TABLET BY MOUTH DAILY   KLOR-CON/EF 25 MEQ  DISINTEGRATING TABLET    ALLOW 1 TABLET TO DISSOLVE COMPLETELY IN 4 OZ WATER BEFORE DRINKING ONCE A DAY   LINAGLIPTIN (TRADJENTA) 5 MG TABS TABLET    Take 1 tablet (5 mg total) by mouth daily.   TRIGELS-F FORTE 460-60-0.01-1 MG CAPS CAPSULE    TAKE ONE CAPSULE EVERY DAY  Modified Medications   No medications on file  Discontinued Medications   No medications on file     Physical Exam: Filed Vitals:   04/15/14 0737  BP: 120/80  Pulse: 57  Temp: 97.6 F (36.4 C)  TempSrc: Oral  Resp: 18  Height: 5\' 7"  (1.702 m)  Weight: 171 lb 12.8 oz (77.928 kg)  SpO2: 97%  Physical Exam  Constitutional: She is oriented to person, place, and time. She appears well-developed and well-nourished.  Cardiovascular: Normal rate, regular rhythm, normal heart sounds and intact distal pulses.   Pulmonary/Chest: Effort normal and breath sounds normal.  Abdominal: Soft. Bowel sounds are normal. She exhibits no distension and no mass. There is no tenderness.  Musculoskeletal: Normal range of motion.  Neurological: She is alert and oriented to person, place, and time.  Skin: Skin is warm and dry.  Psychiatric: She has a normal mood and affect.    Labs reviewed: Basic Metabolic Panel:  Recent Labs  06/05/13 1000 10/23/13 0850  NA 141 141  K 4.3 4.0  CL 99 98  CO2 24 24  GLUCOSE 110* 120*  BUN 13 13  CREATININE 0.70 0.70  CALCIUM 9.5 10.0   Liver Function Tests:  Recent Labs  06/05/13 1000  AST 42*  ALT 55*  ALKPHOS 48  BILITOT 0.5  PROT 7.3   No results for input(s): LIPASE, AMYLASE in the last 8760 hours. No results for input(s): AMMONIA in the last 8760 hours. CBC:  Recent Labs  06/05/13 1000 10/23/13 0850  WBC 7.3 6.2  NEUTROABS 4.2 3.3  HGB 12.0 11.4  HCT 38.2 35.4  MCV 74* 70*  PLT 333 336   Lipid Panel:  Recent Labs  06/05/13 1000 10/23/13 0850  HDL 43 40  LDLCALC 138* 132*  TRIG 155* 211*  CHOLHDL 4.9* 5.4*   Lab Results  Component Value Date   HGBA1C  7.0* 10/23/2013     Assessment/Plan 1. Essential hypertension, benign -bp at goal, takes only hctz at this time  2. Diabetes mellitus type II, controlled, with no complications -cont tradjenta, atorvastatin, start baby asa - aspirin 81 MG EC tablet; Take 1 tablet (81 mg total) by mouth daily. Swallow whole.  Dispense: 30 tablet; Refill: 3 - Basic metabolic panel - Hemoglobin A1c  3. Anemia, iron deficiency - blood counts had stabilized last time, recheck - CBC with  Differential/Platelet  4. Hyperlipidemia LDL goal <100 -cont statin and recheck flp  5. Palpitations - suspect due to abnormal potassium vs. Stress, so check bmp and had prior normal ekg and in sinus by exam today w/o active symptoms;  If symptoms recur, will set up with holter monitor  - aspirin 81 MG EC tablet; Take 1 tablet (81 mg total) by mouth daily. Swallow whole.  Dispense: 30 tablet; Refill: 3 - Basic metabolic panel  Refuses prevnar for now.  Labs/tests ordered:   Orders Placed This Encounter  Procedures  . Basic metabolic panel  . Hemoglobin A1c  . CBC with Differential/Platelet    Next appt:  3 mos  Arav Bannister L. Rawad Bochicchio, D.O. [facility] [address], [place] [address] Cell Phone (Mon-Fri 8am-5pm):  [phone] On Call:  [phone] & follow prompts after 5pm & weekends Office Phone:  [phone] Office Fax:  [phone]

## 2014-04-15 NOTE — Patient Instructions (Signed)
We will check your potassium levels today.  If they are normal and you have persistent palpitations, we will set you up with a monitor to see what the rhythm is when you have palpitations.

## 2014-04-16 LAB — BASIC METABOLIC PANEL
BUN/Creatinine Ratio: 31 — ABNORMAL HIGH (ref 9–23)
BUN: 20 mg/dL (ref 6–24)
CO2: 25 mmol/L (ref 18–29)
Calcium: 9.6 mg/dL (ref 8.7–10.2)
Chloride: 99 mmol/L (ref 97–108)
Creatinine, Ser: 0.65 mg/dL (ref 0.57–1.00)
GFR calc Af Amer: 120 mL/min/{1.73_m2} (ref >59)
GFR calc non Af Amer: 104 mL/min/{1.73_m2} (ref >59)
Glucose: 105 mg/dL — ABNORMAL HIGH (ref 65–99)
Potassium: 3.8 mmol/L (ref 3.5–5.2)
Sodium: 141 mmol/L (ref 134–144)

## 2014-04-16 LAB — CBC WITH DIFFERENTIAL/PLATELET
Basophils Absolute: 0 10*3/uL (ref 0.0–0.2)
Basos: 1 %
Eos: 2 %
Eosinophils Absolute: 0.1 10*3/uL (ref 0.0–0.4)
HCT: 36.5 % (ref 34.0–46.6)
Hemoglobin: 11.8 g/dL (ref 11.1–15.9)
Immature Grans (Abs): 0 10*3/uL (ref 0.0–0.1)
Immature Granulocytes: 0 %
Lymphocytes Absolute: 2.1 10*3/uL (ref 0.7–3.1)
Lymphs: 37 %
MCH: 23.5 pg — ABNORMAL LOW (ref 26.6–33.0)
MCHC: 32.3 g/dL (ref 31.5–35.7)
MCV: 73 fL — ABNORMAL LOW (ref 79–97)
Monocytes Absolute: 0.4 10*3/uL (ref 0.1–0.9)
Monocytes: 8 %
Neutrophils Absolute: 3 10*3/uL (ref 1.4–7.0)
Neutrophils Relative %: 52 %
Platelets: 345 10*3/uL (ref 150–379)
RBC: 5.03 x10E6/uL (ref 3.77–5.28)
RDW: 16.5 % — ABNORMAL HIGH (ref 12.3–15.4)
WBC: 5.7 10*3/uL (ref 3.4–10.8)

## 2014-04-16 LAB — HEMOGLOBIN A1C
Est. average glucose Bld gHb Est-mCnc: 134 mg/dL
Hgb A1c MFr Bld: 6.3 % — ABNORMAL HIGH (ref 4.8–5.6)

## 2014-06-04 ENCOUNTER — Other Ambulatory Visit: Payer: Self-pay | Admitting: Internal Medicine

## 2014-07-16 ENCOUNTER — Ambulatory Visit (INDEPENDENT_AMBULATORY_CARE_PROVIDER_SITE_OTHER): Payer: BLUE CROSS/BLUE SHIELD | Admitting: Internal Medicine

## 2014-07-16 ENCOUNTER — Encounter: Payer: Self-pay | Admitting: Internal Medicine

## 2014-07-16 VITALS — BP 110/72 | HR 59 | Temp 97.9°F | Resp 18 | Ht 67.0 in | Wt 168.6 lb

## 2014-07-16 DIAGNOSIS — D509 Iron deficiency anemia, unspecified: Secondary | ICD-10-CM | POA: Diagnosis not present

## 2014-07-16 DIAGNOSIS — R252 Cramp and spasm: Secondary | ICD-10-CM

## 2014-07-16 DIAGNOSIS — I10 Essential (primary) hypertension: Secondary | ICD-10-CM

## 2014-07-16 DIAGNOSIS — E119 Type 2 diabetes mellitus without complications: Secondary | ICD-10-CM | POA: Diagnosis not present

## 2014-07-16 DIAGNOSIS — E785 Hyperlipidemia, unspecified: Secondary | ICD-10-CM

## 2014-07-16 NOTE — Progress Notes (Signed)
Patient ID: Joyce Richard, female   DOB: 1965-07-02, 49 y.o.   MRN: [id number]   Location:  [facility] [place] [facility]  Code Status: full code Goals of Care: Advanced Directive information Does patient have an advance directive?: No, Would patient like information on creating an advanced directive?: Yes - Educational materials given (at a previous visit, but she has not yet done an advance directive)   Chief Complaint  Patient presents with  . Medical Management of Chronic Issues    HPI: Patient is a 49 y.o. white female seen in the office today for medical mgt of chronic issues.   Nothing new.  Getting cramps in her legs once in a while.  Has been drinking banana smoothies.  Could be cement she walks on at [employer] she must wear at work are getting worn down and she needs new ones.   Saw Dr. Sherlean Foot several months ago--Dec/Jan--saw female physician who said she has no diabetes in her eyes.  Requested that she ask them to send [place] a note.  Located at [facility].  Wears glasses for distance.  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  Review of Systems:  Review of Systems  Constitutional: Negative for fever and chills.  HENT: Negative for hearing loss.   Eyes: Positive for blurred vision.       Right eye chronically; wears glasses for distance  Respiratory: Negative for shortness of breath.   Cardiovascular: Negative for chest pain and leg swelling.  Gastrointestinal: Negative for abdominal pain.  Genitourinary: Negative for dysuria.  Musculoskeletal: Negative for back pain.       Leg cramps  Skin: Negative for itching and rash.  Neurological: Negative for dizziness.  Psychiatric/Behavioral: Negative for depression and memory loss.    Past Medical History  Diagnosis Date  . Hyperpotassemia   . Other abnormal blood chemistry   . Nontoxic uninodular goiter   . Essential hypertension, benign   . Routine general medical  examination at a health care facility   . Anemia, unspecified   . Candidiasis of vulva and vagina   . Dysuria   . Anxiety state, unspecified   . Palpitations   . Shortness of breath   . Routine gynecological examination   . Esophageal reflux   . Acute frontal sinusitis   . Unspecified pruritic disorder   . Other and unspecified hyperlipidemia   . Other abnormal blood chemistry   . Neoplasm of uncertain behavior of skin   . Sprain of tibiofibular (ligament), distal of ankle   . Unspecified constipation   . Hypopotassemia   . Iron deficiency anemia, unspecified   . Unspecified essential hypertension   . Contact dermatitis and other eczema, due to unspecified cause   . Loss of weight   . Dysuria   . Edema   . Urinary tract infection, site not specified   . Lumbago   . Headache(784.0)   . Dysmenorrhea   . Dizziness and giddiness   . Abdominal pain, right lower quadrant   . Pure hypercholesterolemia   . External hemorrhoids without mention of complication   . Allergic rhinitis, cause unspecified   . Acute upper respiratory infections of unspecified site   . Reflux esophagitis     No past surgical history on file.  Allergies  Allergen Reactions  . Metformin And Related Palpitations  . Crestor [Rosuvastatin]   . Other     Cranberries   . Penicillins  Pt was told by father  NOT to take Penicillins.  . Sulfa Antibiotics   . Vioxx [Rofecoxib]    Medications: Patient's Medications  New Prescriptions   No medications on file  Previous Medications   ASPIRIN 81 MG EC TABLET    Take 1 tablet (81 mg total) by mouth daily. Swallow whole.   ATORVASTATIN (LIPITOR) 40 MG TABLET    Take 1 tablet (40 mg total) by mouth daily.   CINNAMON PO    Take 2 tablets by mouth daily.   DOCUSATE SODIUM (COLACE) 50 MG CAPSULE    Take by mouth at bedtime. For constipation   FISH OIL-OMEGA-3 FATTY ACIDS 1000 MG CAPSULE    Take by mouth daily.   HYDROCHLOROTHIAZIDE (HYDRODIURIL) 25 MG TABLET     TAKE 1 TABLET BY MOUTH DAILY   LINAGLIPTIN (TRADJENTA) 5 MG TABS TABLET    Take 1 tablet (5 mg total) by mouth daily.   TRIGELS-F FORTE 460-60-0.01-1 MG CAPS CAPSULE    TAKE ONE CAPSULE EVERY DAY  Modified Medications   No medications on file  Discontinued Medications   No medications on file    Physical Exam: Filed Vitals:   07/16/14 0818  BP: 110/72  Pulse: 59  Temp: 97.9 F (36.6 C)  TempSrc: Oral  Resp: 18  Height: 5\' 7"  (1.702 m)  Weight: 168 lb 9.6 oz (76.476 kg)  SpO2: 98%   Physical Exam  Constitutional: She is oriented to person, place, and time. She appears well-developed and well-nourished.  HENT:  Head: Normocephalic and atraumatic.  Cardiovascular: Normal rate, regular rhythm, normal heart sounds and intact distal pulses.   Pulmonary/Chest: Effort normal and breath sounds normal. No respiratory distress.  Abdominal: Soft. Bowel sounds are normal.  Musculoskeletal: Normal range of motion.  Neurological: She is alert and oriented to person, place, and time.  Psychiatric: She has a normal mood and affect.    Labs reviewed: Basic Metabolic Panel:  Recent Labs  10/23/13 0850 04/15/14 0818  NA 141 141  K 4.0 3.8  CL 98 99  CO2 24 25  GLUCOSE 120* 105*  BUN 13 20  CREATININE 0.70 0.65  CALCIUM 10.0 9.6   CBC:  Recent Labs  10/23/13 0850 04/15/14 0818  WBC 6.2 5.7  NEUTROABS 3.3 3.0  HGB 11.4 11.8  HCT 35.4 36.5  MCV 70* 73*  PLT 336 345   Lipid Panel:  Recent Labs  10/23/13 0850  CHOL 214*  HDL 40  LDLCALC 132*  TRIG 211*  CHOLHDL 5.4*   Lab Results  Component Value Date   HGBA1C 6.3* 04/15/2014    Assessment/Plan 1. Diabetes mellitus type II, controlled, with no complications - has improved her diet; doing well - check urine microalbumin - f/u Hemoglobin A1c which had improved last visit -cont tradjenta, diet, exercising  2. Essential hypertension, benign - bp at goal, cont hctz--if proteinuria, will change to  lisinopril  3. Anemia, iron deficiency -f/u cbc, continues on iron  4. Hyperlipidemia LDL goal <100 - Lipid panel, cont pravachol and await results to see if now at goal  5. Cramp of both lower extremities - suspect due to more to walking on cement floors with old shoes at work, so plans to get new ones, but will also check electrolytes--suspect these will be normal due to her smoothie intake - Comprehensive metabolic panel - Magnesium  Labs/tests ordered: Orders Placed This Encounter  Procedures  . Hemoglobin A1c  . Lipid panel    Order  Specific Question:  Has the patient fasted?    Answer:  Yes  . Comprehensive metabolic panel    Order Specific Question:  Has the patient fasted?    Answer:  Yes  . Magnesium  . CBC with Differential  . Microalbumin/Creatinine Ratio, Urine    Next appt:  4 mos  Devynn Hessler L. Jahna Liebert, D.O. [facility] [address], [place] [address] Cell Phone (Mon-Fri 8am-5pm):  [phone] On Call:  [phone] & follow prompts after 5pm & weekends Office Phone:  [phone] Office Fax:  [phone]

## 2014-07-17 LAB — CBC WITH DIFFERENTIAL/PLATELET
Basophils Absolute: 0.1 10*3/uL (ref 0.0–0.2)
Basos: 1 %
EOS (ABSOLUTE): 0.1 10*3/uL (ref 0.0–0.4)
Eos: 2 %
Hematocrit: 37.1 % (ref 34.0–46.6)
Hemoglobin: 11.9 g/dL (ref 11.1–15.9)
Immature Grans (Abs): 0 10*3/uL (ref 0.0–0.1)
Immature Granulocytes: 0 %
Lymphocytes Absolute: 2.3 10*3/uL (ref 0.7–3.1)
Lymphs: 41 %
MCH: 23.3 pg — ABNORMAL LOW (ref 26.6–33.0)
MCHC: 32.1 g/dL (ref 31.5–35.7)
MCV: 73 fL — ABNORMAL LOW (ref 79–97)
Monocytes Absolute: 0.4 10*3/uL (ref 0.1–0.9)
Monocytes: 7 %
Neutrophils Absolute: 2.7 10*3/uL (ref 1.4–7.0)
Neutrophils: 49 %
Platelets: 301 10*3/uL (ref 150–379)
RBC: 5.11 x10E6/uL (ref 3.77–5.28)
RDW: 17.2 % — ABNORMAL HIGH (ref 12.3–15.4)
WBC: 5.6 10*3/uL (ref 3.4–10.8)

## 2014-07-17 LAB — COMPREHENSIVE METABOLIC PANEL
ALT: 33 IU/L — ABNORMAL HIGH (ref 0–32)
AST: 21 IU/L (ref 0–40)
Albumin/Globulin Ratio: 1.7 (ref 1.1–2.5)
Albumin: 4.7 g/dL (ref 3.5–5.5)
Alkaline Phosphatase: 44 IU/L (ref 39–117)
BUN/Creatinine Ratio: 21 (ref 9–23)
BUN: 15 mg/dL (ref 6–24)
Bilirubin Total: 0.4 mg/dL (ref 0.0–1.2)
CO2: 23 mmol/L (ref 18–29)
Calcium: 9.8 mg/dL (ref 8.7–10.2)
Chloride: 99 mmol/L (ref 97–108)
Creatinine, Ser: 0.73 mg/dL (ref 0.57–1.00)
GFR calc Af Amer: 111 mL/min/{1.73_m2} (ref >59)
GFR calc non Af Amer: 96 mL/min/{1.73_m2} (ref >59)
Globulin, Total: 2.8 g/dL (ref 1.5–4.5)
Glucose: 110 mg/dL — ABNORMAL HIGH (ref 65–99)
Potassium: 4.2 mmol/L (ref 3.5–5.2)
Sodium: 140 mmol/L (ref 134–144)
Total Protein: 7.5 g/dL (ref 6.0–8.5)

## 2014-07-17 LAB — MICROALBUMIN / CREATININE URINE RATIO
Creatinine, Urine: 136.3 mg/dL
MICROALB/CREAT RATIO: 11.1 mg/g creat (ref 0.0–30.0)
Microalbumin, Urine: 15.1 ug/mL

## 2014-07-17 LAB — HEMOGLOBIN A1C
Est. average glucose Bld gHb Est-mCnc: 143 mg/dL
Hgb A1c MFr Bld: 6.6 % — ABNORMAL HIGH (ref 4.8–5.6)

## 2014-07-17 LAB — LIPID PANEL
Chol/HDL Ratio: 4.8 ratio units — ABNORMAL HIGH (ref 0.0–4.4)
Cholesterol, Total: 217 mg/dL — ABNORMAL HIGH (ref 100–199)
HDL: 45 mg/dL (ref >39)
LDL Calculated: 133 mg/dL — ABNORMAL HIGH (ref 0–99)
Triglycerides: 195 mg/dL — ABNORMAL HIGH (ref 0–149)
VLDL Cholesterol Cal: 39 mg/dL (ref 5–40)

## 2014-07-17 LAB — MAGNESIUM: Magnesium: 2.1 mg/dL (ref 1.6–2.3)

## 2014-07-28 ENCOUNTER — Telehealth: Payer: Self-pay

## 2014-07-28 MED ORDER — PRAVASTATIN SODIUM 40 MG PO TABS: 40.0000 mg | ORAL_TABLET | Freq: Every day | ORAL | Status: DC

## 2014-07-28 NOTE — Telephone Encounter (Signed)
-----   Message from Gayland Curry, DO sent at 07/17/2014 11:14 AM EDT ----- Unfortunately, her sugar average has continued to trend upward.  I recommend we add metformin 500mg  po bid with meals for her diabetes.  At one point she was on lipitor, and there's a note where I asked her to increase it to 40mg , but it appears she's not on a statin at all.  I recommend she try a different one (pravachol 40mg ) if she did not tolerate that one or the cost was too much. Good new is that her electrolytes including magnesium, calcium and potassium are normal.  I suspect her cramping of her legs is from old shoes on the cement floor.

## 2014-07-28 NOTE — Telephone Encounter (Signed)
Discussed with patient, patient verbalized understanding of results. Patient states she has metformin on hand from a previous rx and she will began taking. RX for Pravastatin sent to pharmacy. Copy of labs mailed

## 2014-08-21 ENCOUNTER — Other Ambulatory Visit: Payer: Self-pay | Admitting: Internal Medicine

## 2014-11-12 ENCOUNTER — Ambulatory Visit: Payer: BLUE CROSS/BLUE SHIELD | Admitting: Internal Medicine

## 2014-12-17 LAB — HM MAMMOGRAPHY: HM Mammogram: NORMAL

## 2014-12-31 ENCOUNTER — Ambulatory Visit: Payer: BLUE CROSS/BLUE SHIELD | Admitting: Internal Medicine

## 2015-01-10 ENCOUNTER — Other Ambulatory Visit: Payer: Self-pay | Admitting: Internal Medicine

## 2015-01-28 ENCOUNTER — Ambulatory Visit (INDEPENDENT_AMBULATORY_CARE_PROVIDER_SITE_OTHER): Payer: BLUE CROSS/BLUE SHIELD | Admitting: Internal Medicine

## 2015-01-28 ENCOUNTER — Encounter: Payer: Self-pay | Admitting: Internal Medicine

## 2015-01-28 VITALS — BP 148/94 | HR 64 | Temp 98.0°F | Wt 169.0 lb

## 2015-01-28 DIAGNOSIS — M545 Low back pain, unspecified: Secondary | ICD-10-CM

## 2015-01-28 DIAGNOSIS — E119 Type 2 diabetes mellitus without complications: Secondary | ICD-10-CM

## 2015-01-28 DIAGNOSIS — D509 Iron deficiency anemia, unspecified: Secondary | ICD-10-CM

## 2015-01-28 DIAGNOSIS — I83813 Varicose veins of bilateral lower extremities with pain: Secondary | ICD-10-CM

## 2015-01-28 DIAGNOSIS — E785 Hyperlipidemia, unspecified: Secondary | ICD-10-CM | POA: Diagnosis not present

## 2015-01-28 DIAGNOSIS — I10 Essential (primary) hypertension: Secondary | ICD-10-CM | POA: Diagnosis not present

## 2015-01-28 NOTE — Patient Instructions (Signed)
I recommend compression socks while at work to help with the varicose veins.

## 2015-01-28 NOTE — Progress Notes (Signed)
Patient ID: Joyce Richard, female   DOB: Aug 08, 1965, 49 y.o.   MRN: [id number]   Location: [facility] Provider: Rexene Edison. Mariea Clonts, D.O., C.M.D.  Code Status: full code Goals of Care: Advanced Directive information Does patient have an advance directive?: No  Chief Complaint  Patient presents with  . Medical Management of Chronic Issues    4 month check on blood sugar, blood pressure, cholesterol    HPI: Patient is a 49 y.o. female seen in the office today for 4 month checkup.    Hyperlipidemia:  Was only taking fish oil at the time.  Took some pravachol for a little bit, but caused leg cramps.  Lipitor and crestor also caused leg cramps for her.  Has been eating more veggies, salads and fruits.  Also grilled chicken instead of fried.   Also is taking her metformin for the sugar.  Also taking tradjenta.    Has been stressed at work and didn't take hctz yet this am.  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  Walks a lot for work.      Back pain not bothersome recently.    Also has not had trouble with palpitations.    Has had some stinging of her veins before from standing on the hard concrete floors at work.  Has several spider veins.  Refuses flu shot.  Had [facility] exam [facility] [phone] 11/23/13  Also had mammogram with Dr. Matthew Saras in August 8/16  Review of Systems:  Review of Systems  Constitutional: Negative for fever and chills.  HENT: Negative for congestion and hearing loss.   Eyes: Negative for blurred vision.  Respiratory: Negative for shortness of breath.   Cardiovascular: Negative for chest pain, palpitations and leg swelling.  Gastrointestinal: Negative for abdominal pain.  Genitourinary: Negative for dysuria.  Musculoskeletal: Positive for back pain. Negative for falls.  Skin: Negative for itching and rash.  Neurological: Negative for dizziness.    Endo/Heme/Allergies: Does not bruise/bleed easily.  Psychiatric/Behavioral: Negative for depression and memory loss.    Past Medical History  Diagnosis Date  . Hyperpotassemia   . Other abnormal blood chemistry   . Nontoxic uninodular goiter   . Essential hypertension, benign   . Routine general medical examination at a health care facility   . Anemia, unspecified   . Candidiasis of vulva and vagina   . Dysuria   . Anxiety state, unspecified   . Palpitations   . Shortness of breath   . Routine gynecological examination   . Esophageal reflux   . Acute frontal sinusitis   . Unspecified pruritic disorder   . Other and unspecified hyperlipidemia   . Other abnormal blood chemistry   . Neoplasm of uncertain behavior of skin   . Sprain of tibiofibular (ligament), distal of ankle   . Unspecified constipation   . Hypopotassemia   . Iron deficiency anemia, unspecified   . Unspecified essential hypertension   . Contact dermatitis and other eczema, due to unspecified cause   . Loss of weight   . Dysuria   . Edema   . Urinary tract infection, site not specified   . Lumbago   . Headache(784.0)   . Dysmenorrhea   . Dizziness and giddiness   . Abdominal pain, right lower quadrant   . Pure hypercholesterolemia   . External hemorrhoids without mention of complication   . Allergic rhinitis, cause  unspecified   . Acute upper respiratory infections of unspecified site   . Reflux esophagitis     History reviewed. No pertinent past surgical history.  Allergies  Allergen Reactions  . Metformin And Related Palpitations  . Crestor [Rosuvastatin]   . Other     Cranberries   . Penicillins     Pt was told by father  NOT to take Penicillins.  . Sulfa Antibiotics   . Vioxx [Rofecoxib]       Medication List       This list is accurate as of: 01/28/15  8:38 AM.  Always use your most recent med list.               aspirin 81 MG EC tablet  Take 1 tablet (81 mg total) by mouth  daily. Swallow whole.     atorvastatin 40 MG tablet  Commonly known as:  LIPITOR  Take 1 tablet (40 mg total) by mouth daily.     CINNAMON PO  Take 2 tablets by mouth daily.     docusate sodium 50 MG capsule  Commonly known as:  COLACE  Take by mouth at bedtime. For constipation     fish oil-omega-3 fatty acids 1000 MG capsule  Take by mouth daily.     hydrochlorothiazide 25 MG tablet  Commonly known as:  HYDRODIURIL  TAKE 1 TABLET BY MOUTH DAILY     linagliptin 5 MG Tabs tablet  Commonly known as:  TRADJENTA  Take 1 tablet (5 mg total) by mouth daily.     metFORMIN 500 MG tablet  Commonly known as:  GLUCOPHAGE  Take 500 mg by mouth 2 (two) times daily with a meal.     pravastatin 40 MG tablet  Commonly known as:  PRAVACHOL  Take 1 tablet (40 mg total) by mouth daily. For High Cholesterol     TRIGELS-F FORTE 460-60-0.01-1 MG Caps capsule  Generic drug:  Fe Fum-Vit C-Vit B12-FA  TAKE ONE CAPSULE EVERY DAY        Health Maintenance  Topic Date Due  . PNEUMOCOCCAL POLYSACCHARIDE VACCINE (1) 10/16/1965  . OPHTHALMOLOGY EXAM  10/16/1973  . HIV Screening  10/17/1978  . COLONOSCOPY  10/16/2013  . INFLUENZA VACCINE  09/20/2014  . FOOT EXAM  10/24/2014  . MAMMOGRAM  11/20/2014  . HEMOGLOBIN A1C  01/16/2015  . URINE MICROALBUMIN  07/16/2015  . PAP SMEAR  11/20/2015  . TETANUS/TDAP  05/13/2021  . Hepatitis C Screening  Completed    Physical Exam: Filed Vitals:   01/28/15 0833  BP: 148/94  Pulse: 64  Temp: 98 F (36.7 C)  TempSrc: Oral  Weight: 169 lb (76.658 kg)  SpO2: 98%   Body mass index is 26.46 kg/(m^2). Physical Exam  Constitutional: She is oriented to person, place, and time. She appears well-developed and well-nourished.  HENT:  Head: Normocephalic and atraumatic.  Cardiovascular: Normal rate, regular rhythm, normal heart sounds and intact distal pulses.   Pulmonary/Chest: Effort normal and breath sounds normal.  Abdominal: Soft. Bowel sounds are  normal.  Musculoskeletal: Normal range of motion. She exhibits no tenderness.  Neurological: She is alert and oriented to person, place, and time.  See diabetic foot exam  Skin: Skin is warm and dry.  Psychiatric: She has a normal mood and affect.    Labs reviewed: Basic Metabolic Panel:  Recent Labs  04/15/14 0818 07/16/14 0901  NA 141 140  K 3.8 4.2  CL 99 99  CO2 25 23  GLUCOSE  105* 110*  BUN 20 15  CREATININE 0.65 0.73  CALCIUM 9.6 9.8  MG  --  2.1   Liver Function Tests:  Recent Labs  07/16/14 0901  AST 21  ALT 33*  ALKPHOS 44  BILITOT 0.4  PROT 7.5  ALBUMIN 4.7   No results for input(s): LIPASE, AMYLASE in the last 8760 hours. No results for input(s): AMMONIA in the last 8760 hours. CBC:  Recent Labs  04/15/14 0818 07/16/14 0901  WBC 5.7 5.6  NEUTROABS 3.0 2.7  HGB 11.8  --   HCT 36.5 37.1  MCV 73*  --   PLT 345  --    Lipid Panel:  Recent Labs  07/16/14 0901  CHOL 217*  HDL 45  LDLCALC 133*  TRIG 195*  CHOLHDL 4.8*   Lab Results  Component Value Date   HGBA1C 6.6* 07/16/2014   Assessment/Plan 1. Controlled type 2 diabetes mellitus without complication, without long-term current use of insulin ([employer]) - has been well controlled -cont metformin, tradjenta and dietary changes she has made, has very active job so not otherwise exercising -f/u labs today - Comprehensive metabolic panel - Hemoglobin A1c  2. Essential hypertension, benign -bp elevated this am but has not had her medication (did not take when fasting--did not remember that she can take meds with some water/black coffee) -cont hctz and monitor -also highly stressed at work  3. Anemia, iron deficiency - f/u labs -no symptoms, no bleeding - CBC with Differential/Platelet  4. Hyperlipidemia LDL goal <100 - does not tolerate statin therapy--we have tried three different agents all of which caused leg cramps and some elevation of transaminases - Lipid panel  5. Midline  low back pain without sciatica -bothersome on long work days when she lifts heavier items -has not had to take medication recently  6. Varicose veins of both lower extremities with pain -advised use of compression hose or socks to help with this when she's on her feet all day -cont to monitor  Labs/tests ordered:   Orders Placed This Encounter  Procedures  . CBC with Differential/Platelet  . Comprehensive metabolic panel    Order Specific Question:  Has the patient fasted?    Answer:  Yes  . Hemoglobin A1c  . Lipid panel    Order Specific Question:  Has the patient fasted?    Answer:  Yes    Next appt:  05/30/2015 with labs that day if needed   Clay. Mihcael Ledee, D.O. [facility] [address], [place] [address] Cell Phone (Mon-Fri 8am-5pm):  [phone] On Call:  [phone] & follow prompts after 5pm & weekends Office Phone:  [phone] Office Fax:  [phone]

## 2015-01-29 ENCOUNTER — Other Ambulatory Visit: Payer: Self-pay | Admitting: Internal Medicine

## 2015-01-29 DIAGNOSIS — E782 Mixed hyperlipidemia: Secondary | ICD-10-CM

## 2015-01-29 LAB — CBC WITH DIFFERENTIAL/PLATELET
Basophils Absolute: 0 10*3/uL (ref 0.0–0.2)
Basos: 1 %
EOS (ABSOLUTE): 0.1 10*3/uL (ref 0.0–0.4)
Eos: 2 %
Hematocrit: 38.8 % (ref 34.0–46.6)
Hemoglobin: 12.5 g/dL (ref 11.1–15.9)
Immature Grans (Abs): 0 10*3/uL (ref 0.0–0.1)
Immature Granulocytes: 0 %
Lymphocytes Absolute: 2.2 10*3/uL (ref 0.7–3.1)
Lymphs: 37 %
MCH: 24.4 pg — ABNORMAL LOW (ref 26.6–33.0)
MCHC: 32.2 g/dL (ref 31.5–35.7)
MCV: 76 fL — ABNORMAL LOW (ref 79–97)
Monocytes Absolute: 0.3 10*3/uL (ref 0.1–0.9)
Monocytes: 5 %
Neutrophils Absolute: 3.2 10*3/uL (ref 1.4–7.0)
Neutrophils: 55 %
Platelets: 319 10*3/uL (ref 150–379)
RBC: 5.12 x10E6/uL (ref 3.77–5.28)
RDW: 16.4 % — ABNORMAL HIGH (ref 12.3–15.4)
WBC: 5.8 10*3/uL (ref 3.4–10.8)

## 2015-01-29 LAB — COMPREHENSIVE METABOLIC PANEL
ALT: 38 IU/L — ABNORMAL HIGH (ref 0–32)
AST: 24 IU/L (ref 0–40)
Albumin/Globulin Ratio: 2.3 (ref 1.1–2.5)
Albumin: 4.8 g/dL (ref 3.5–5.5)
Alkaline Phosphatase: 42 IU/L (ref 39–117)
BUN/Creatinine Ratio: 24 — ABNORMAL HIGH (ref 9–23)
BUN: 13 mg/dL (ref 6–24)
Bilirubin Total: 0.4 mg/dL (ref 0.0–1.2)
CO2: 23 mmol/L (ref 18–29)
Calcium: 9.7 mg/dL (ref 8.7–10.2)
Chloride: 99 mmol/L (ref 97–106)
Creatinine, Ser: 0.55 mg/dL — ABNORMAL LOW (ref 0.57–1.00)
GFR calc Af Amer: 126 mL/min/{1.73_m2} (ref >59)
GFR calc non Af Amer: 109 mL/min/{1.73_m2} (ref >59)
Globulin, Total: 2.1 g/dL (ref 1.5–4.5)
Glucose: 103 mg/dL — ABNORMAL HIGH (ref 65–99)
Potassium: 4.2 mmol/L (ref 3.5–5.2)
Sodium: 139 mmol/L (ref 136–144)
Total Protein: 6.9 g/dL (ref 6.0–8.5)

## 2015-01-29 LAB — LIPID PANEL
Chol/HDL Ratio: 5.2 ratio units — ABNORMAL HIGH (ref 0.0–4.4)
Cholesterol, Total: 238 mg/dL — ABNORMAL HIGH (ref 100–199)
HDL: 46 mg/dL (ref >39)
LDL Calculated: 152 mg/dL — ABNORMAL HIGH (ref 0–99)
Triglycerides: 199 mg/dL — ABNORMAL HIGH (ref 0–149)
VLDL Cholesterol Cal: 40 mg/dL (ref 5–40)

## 2015-01-29 LAB — HEMOGLOBIN A1C
Est. average glucose Bld gHb Est-mCnc: 140 mg/dL
Hgb A1c MFr Bld: 6.5 % — ABNORMAL HIGH (ref 4.8–5.6)

## 2015-01-29 MED ORDER — FENOFIBRATE 145 MG PO TABS: 145.0000 mg | ORAL_TABLET | Freq: Every day | ORAL | Status: DC

## 2015-02-09 ENCOUNTER — Other Ambulatory Visit: Payer: Self-pay

## 2015-02-09 DIAGNOSIS — R748 Abnormal levels of other serum enzymes: Secondary | ICD-10-CM

## 2015-02-15 ENCOUNTER — Telehealth: Payer: Self-pay | Admitting: *Deleted

## 2015-02-15 NOTE — Telephone Encounter (Signed)
On a previous occasion her liver numbers were elevated also, and cause was not clear. I had done a hepatitis panel then which was normal. I would like for her to have a right upper quadrant ultrasound to evaluate for gall stones due to these results. She should continue her fish oil and her water pill. Please make an addendum to her last chart note or labs so that we have this in her chart. These messages disappear later and then I don't remember what we did. Thanks!     ----- Message -----     From: Eilene Ghazi, RMA     Sent: 02/07/2015  5:00 PM      To: Gayland Curry, DO        Patient arrived at the office for lab results, explained to her the labs and her question was what did you mean by liver transaminase has increased? She also is taking fish oil x 2 daily and 1 water pill, wants to know if she should stop taking the water pill?       Notes Recorded by Oralia Manis, CMA on 02/09/2015 at 1:47 PM Called spoke with patient about her getting the ultra sound to check her Gall Bladder to see why her liver enzymes are elevated per Dr. Mariea Clonts. She had no objections to this sending referral for ultra sound

## 2015-02-20 ENCOUNTER — Other Ambulatory Visit: Payer: Self-pay | Admitting: Internal Medicine

## 2015-02-23 ENCOUNTER — Ambulatory Visit
Admission: RE | Admit: 2015-02-23 | Discharge: 2015-02-23 | Disposition: A | Discharge status: Home / Self Care | Payer: BLUE CROSS/BLUE SHIELD | Source: Ambulatory Visit | Attending: Internal Medicine | Admitting: Internal Medicine

## 2015-02-23 DIAGNOSIS — R748 Abnormal levels of other serum enzymes: Secondary | ICD-10-CM

## 2015-02-28 ENCOUNTER — Encounter: Payer: Self-pay | Admitting: *Deleted

## 2015-03-10 ENCOUNTER — Encounter: Payer: Self-pay | Admitting: Internal Medicine

## 2015-05-30 ENCOUNTER — Ambulatory Visit: Payer: BLUE CROSS/BLUE SHIELD | Admitting: Internal Medicine

## 2015-06-24 ENCOUNTER — Other Ambulatory Visit: Payer: Self-pay | Admitting: *Deleted

## 2015-06-24 MED ORDER — HYDROCHLOROTHIAZIDE 25 MG PO TABS: 25.0000 mg | ORAL_TABLET | Freq: Every day | ORAL | Status: DC

## 2015-06-24 NOTE — Telephone Encounter (Signed)
[facility] 

## 2015-07-04 ENCOUNTER — Other Ambulatory Visit: Payer: Self-pay | Admitting: *Deleted

## 2015-07-04 MED ORDER — HYDROCHLOROTHIAZIDE 25 MG PO TABS: 25.0000 mg | ORAL_TABLET | Freq: Every day | ORAL | Status: DC

## 2015-07-04 NOTE — Telephone Encounter (Signed)
[facility] 

## 2015-07-21 ENCOUNTER — Ambulatory Visit: Payer: BLUE CROSS/BLUE SHIELD | Admitting: Internal Medicine

## 2015-07-23 LAB — HM DIABETES EYE EXAM

## 2015-08-05 ENCOUNTER — Ambulatory Visit: Payer: BLUE CROSS/BLUE SHIELD | Admitting: Internal Medicine

## 2015-09-12 ENCOUNTER — Ambulatory Visit (INDEPENDENT_AMBULATORY_CARE_PROVIDER_SITE_OTHER): Payer: BLUE CROSS/BLUE SHIELD | Admitting: Internal Medicine

## 2015-09-12 ENCOUNTER — Ambulatory Visit: Payer: BLUE CROSS/BLUE SHIELD | Admitting: Internal Medicine

## 2015-09-12 ENCOUNTER — Encounter: Payer: Self-pay | Admitting: Internal Medicine

## 2015-09-12 VITALS — BP 120/80 | HR 60 | Temp 98.0°F | Wt 166.0 lb

## 2015-09-12 DIAGNOSIS — D509 Iron deficiency anemia, unspecified: Secondary | ICD-10-CM

## 2015-09-12 DIAGNOSIS — E119 Type 2 diabetes mellitus without complications: Secondary | ICD-10-CM

## 2015-09-12 DIAGNOSIS — S90111A Contusion of right great toe without damage to nail, initial encounter: Secondary | ICD-10-CM

## 2015-09-12 DIAGNOSIS — E785 Hyperlipidemia, unspecified: Secondary | ICD-10-CM | POA: Diagnosis not present

## 2015-09-12 DIAGNOSIS — I10 Essential (primary) hypertension: Secondary | ICD-10-CM

## 2015-09-12 LAB — CBC WITH DIFFERENTIAL/PLATELET
Basophils Absolute: 59 cells/uL (ref 0–200)
Basophils Relative: 1 %
Eosinophils Absolute: 118 cells/uL (ref 15–500)
Eosinophils Relative: 2 %
HCT: 40.7 % (ref 35.0–45.0)
Hemoglobin: 13.2 g/dL (ref 11.7–15.5)
Lymphocytes Relative: 37 %
Lymphs Abs: 2183 cells/uL (ref 850–3900)
MCH: 25 pg — ABNORMAL LOW (ref 27.0–33.0)
MCHC: 32.4 g/dL (ref 32.0–36.0)
MCV: 77.2 fL — ABNORMAL LOW (ref 80.0–100.0)
MPV: 10.1 fL (ref 7.5–12.5)
Monocytes Absolute: 354 cells/uL (ref 200–950)
Monocytes Relative: 6 %
Neutro Abs: 3186 cells/uL (ref 1500–7800)
Neutrophils Relative %: 54 %
Platelets: 320 10*3/uL (ref 140–400)
RBC: 5.27 MIL/uL — ABNORMAL HIGH (ref 3.80–5.10)
RDW: 16.1 % — ABNORMAL HIGH (ref 11.0–15.0)
WBC: 5.9 10*3/uL (ref 3.8–10.8)

## 2015-09-12 LAB — LIPID PANEL
Cholesterol: 211 mg/dL — ABNORMAL HIGH (ref 125–200)
HDL: 45 mg/dL — ABNORMAL LOW (ref >=46)
LDL Cholesterol: 118 mg/dL (ref <130)
Total CHOL/HDL Ratio: 4.7 Ratio (ref <=5.0)
Triglycerides: 238 mg/dL — ABNORMAL HIGH (ref <150)
VLDL: 48 mg/dL — ABNORMAL HIGH (ref <30)

## 2015-09-12 LAB — COMPREHENSIVE METABOLIC PANEL
ALT: 26 U/L (ref 6–29)
AST: 18 U/L (ref 10–35)
Albumin: 4.9 g/dL (ref 3.6–5.1)
Alkaline Phosphatase: 43 U/L (ref 33–130)
BUN: 12 mg/dL (ref 7–25)
CO2: 28 mmol/L (ref 20–31)
Calcium: 9.5 mg/dL (ref 8.6–10.4)
Chloride: 102 mmol/L (ref 98–110)
Creat: 0.62 mg/dL (ref 0.50–1.05)
Glucose, Bld: 101 mg/dL — ABNORMAL HIGH (ref 65–99)
Potassium: 3.9 mmol/L (ref 3.5–5.3)
Sodium: 140 mmol/L (ref 135–146)
Total Bilirubin: 0.5 mg/dL (ref 0.2–1.2)
Total Protein: 7.1 g/dL (ref 6.1–8.1)

## 2015-09-12 LAB — HEMOGLOBIN A1C
Hgb A1c MFr Bld: 6.3 % — ABNORMAL HIGH (ref <5.7)
Mean Plasma Glucose: 134 mg/dL

## 2015-09-12 NOTE — Progress Notes (Signed)
Location:  [facility] Provider:  Sharnelle Cappelli L. Mariea Clonts, D.O., C.M.D.  Code Status: full code Goals of Care:  Advanced Directives 09/12/2015  Does patient have an advance directive? No  Would patient like information on creating an advanced directive? No - patient declined information  Pre-existing out of facility DNR order (yellow form or pink MOST form) -  previously explained advanced directives to pt   Chief Complaint  Patient presents with  . Medical Management of Chronic Issues    follow-up    HPI: Patient is a 50 y.o. female seen today for medical management of chronic diseases.    Hba1c has at two different times been in the diabetic range.  Last was 6.5.    Cannot take statins due to muscle cramps.  Says things are about the same.  Says she is doing better on her diet and eating more salads.  Has been using the zesty [place] vinaigrette type and eating more fruit and veggies.  Down 3 lbs in weight also.  Not as hungry these days.    Pt also has dropped a box on her foot at work.  She has some minor bruising and soreness of her right great toe, but is able to move it fine and walk no problem.  Since she was here, she asked me to look at it.    Past Medical History:  Diagnosis Date  . Abdominal pain, right lower quadrant   . Acute frontal sinusitis   . Acute upper respiratory infections of unspecified site   . Allergic rhinitis, cause unspecified   . Anemia, unspecified   . Anxiety state, unspecified   . Candidiasis of vulva and vagina   . Contact dermatitis and other eczema, due to unspecified cause   . Dizziness and giddiness   . Dysmenorrhea   . Dysuria   . Dysuria   . Edema   . Esophageal reflux   . Essential hypertension, benign   . External hemorrhoids without mention of complication   . Headache(784.0)   . Hyperpotassemia   . Hypopotassemia   . Iron deficiency anemia, unspecified   . Loss of weight   . Lumbago   . Neoplasm of uncertain behavior of skin     . Nontoxic uninodular goiter   . Other abnormal blood chemistry   . Other abnormal blood chemistry   . Other and unspecified hyperlipidemia   . Palpitations   . Pure hypercholesterolemia   . Reflux esophagitis   . Routine general medical examination at a health care facility   . Routine gynecological examination   . Shortness of breath   . Sprain of tibiofibular (ligament), distal of ankle   . Unspecified constipation   . Unspecified essential hypertension   . Unspecified pruritic disorder   . Urinary tract infection, site not specified     No past surgical history on file.  Allergies  Allergen Reactions  . Lipitor [Atorvastatin] Other (See Comments)    Muscle cramps  . Metformin And Related Palpitations  . Pravachol [Pravastatin Sodium] Other (See Comments)    Muscle cramps in legs  . Crestor [Rosuvastatin]   . Other     Cranberries   . Penicillins     Pt was told by father  NOT to take Penicillins.  . Sulfa Antibiotics   . Vioxx [Rofecoxib]       Medication List       Accurate as of 09/12/15  8:45 AM. Always use your most recent med list.  aspirin 81 MG EC tablet Take 1 tablet (81 mg total) by mouth daily. Swallow whole.   CINNAMON PO Take 2 tablets by mouth daily.   docusate sodium 50 MG capsule Commonly known as:  COLACE Take by mouth at bedtime. For constipation   fenofibrate 145 MG tablet Commonly known as:  TRICOR Take 1 tablet (145 mg total) by mouth daily.   fish oil-omega-3 fatty acids 1000 MG capsule Take by mouth daily.   hydrochlorothiazide 25 MG tablet Commonly known as:  HYDRODIURIL Take 1 tablet (25 mg total) by mouth daily.   linagliptin 5 MG Tabs tablet Commonly known as:  TRADJENTA Take 1 tablet (5 mg total) by mouth daily.   metFORMIN 500 MG tablet Commonly known as:  GLUCOPHAGE Take 500 mg by mouth 2 (two) times daily with a meal.   TRIGELS-F FORTE 460-60-0.01-1 MG Caps capsule Generic drug:  Fe Fum-Vit C-Vit  B12-FA TAKE ONE CAPSULE EVERY DAY       Review of Systems:  Review of Systems  Constitutional: Positive for weight loss. Negative for chills, fever and malaise/fatigue.  HENT: Negative for hearing loss.   Eyes: Negative for blurred vision.  Respiratory: Negative for shortness of breath.   Cardiovascular: Negative for chest pain, palpitations and leg swelling.  Gastrointestinal: Negative for abdominal pain, blood in stool, constipation, heartburn and melena.  Genitourinary: Negative for dysuria, frequency and urgency.  Musculoskeletal: Negative for back pain, falls and joint pain.  Neurological: Negative for dizziness, loss of consciousness and weakness.  Endo/Heme/Allergies: Does not bruise/bleed easily.  Psychiatric/Behavioral: Negative for depression and memory loss.    Health Maintenance  Topic Date Due  . PNEUMOCOCCAL POLYSACCHARIDE VACCINE (1) 10/16/1965  . HIV Screening  10/17/1978  . COLONOSCOPY  10/16/2013  . OPHTHALMOLOGY EXAM  11/24/2014  . URINE MICROALBUMIN  07/16/2015  . HEMOGLOBIN A1C  07/29/2015  . INFLUENZA VACCINE  09/20/2015  . FOOT EXAM  01/28/2016  . MAMMOGRAM  12/16/2016  . PAP SMEAR  12/13/2017  . TETANUS/TDAP  05/13/2021  . Hepatitis C Screening  Completed    Physical Exam: Vitals:   09/12/15 0835  BP: 120/80  Pulse: 60  Temp: 98 F (36.7 C)  TempSrc: Oral  SpO2: 98%  Weight: 166 lb (75.3 kg)   Body mass index is 26 kg/m. Physical Exam  Constitutional: She is oriented to person, place, and time. She appears well-developed and well-nourished. No distress.  HENT:  Head: Normocephalic and atraumatic.  Eyes: Pupils are equal, round, and reactive to light.  Cardiovascular: Normal rate, regular rhythm, normal heart sounds and intact distal pulses.   Pulmonary/Chest: Effort normal and breath sounds normal. No respiratory distress.  Abdominal: Bowel sounds are normal.  Musculoskeletal: Normal range of motion.  Able to move right great toe and  ambulate normally  Neurological: She is alert and oriented to person, place, and time.  Skin: Skin is warm and dry. There is pallor.  Minor ecchymoses of right medial great toe  Psychiatric: She has a normal mood and affect. Her behavior is normal. Judgment and thought content normal.    Labs reviewed: Basic Metabolic Panel:  Recent Labs  01/28/15 0903  NA 139  K 4.2  CL 99  CO2 23  GLUCOSE 103*  BUN 13  CREATININE 0.55*  CALCIUM 9.7   Liver Function Tests:  Recent Labs  01/28/15 0903  AST 24  ALT 38*  ALKPHOS 42  BILITOT 0.4  PROT 6.9  ALBUMIN 4.8   No results for  input(s): LIPASE, AMYLASE in the last 8760 hours. No results for input(s): AMMONIA in the last 8760 hours. CBC:  Recent Labs  01/28/15 0903  WBC 5.8  NEUTROABS 3.2  HCT 38.8  MCV 76*  PLT 319   Lipid Panel:  Recent Labs  01/28/15 0903  CHOL 238*  HDL 46  LDLCALC 152*  TRIG 199*  CHOLHDL 5.2*   Lab Results  Component Value Date   HGBA1C 6.5 (H) 01/28/2015    Procedures since last visit: Reports she had eye exam at [facility] 07/23/15 with no retinopathy--need report  Assessment/Plan 1. 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 Essential hypertension, benign - cont current therapy and monitor - CBC with Differential/Platelet - Comprehensive metabolic panel  3. Anemia, iron deficiency - f/u h/h due to this - CBC with Differential/Platelet  4. Hyperlipidemia LDL goal <100 - continues on fibrate and fish oil plus working on dietary changes - Lipid panel  5. Contusion of right great toe without damage to nail, initial encounter -advised to avoid repeat injury and monitor--no evidence of  fracture or serious injury present  Labs/tests ordered:   Orders Placed This Encounter  Procedures  . Microalbumin/Creatinine Ratio, Urine  . CBC with Differential/Platelet  . Hemoglobin A1c  . Lipid panel  . CMP    Next appt:  4 mos for annual with labs fasting day of appt  Akia Montalban L. Charitie Hinote, D.O. [facility] [address], [place] [address] Cell Phone (Mon-Fri 8am-5pm):  [phone] On Call:  [phone] & follow prompts after 5pm & weekends Office Phone:  [phone] Office Fax:  [phone]

## 2015-09-13 LAB — MICROALBUMIN / CREATININE URINE RATIO
Creatinine, Urine: 246 mg/dL (ref 20–320)
Microalb Creat Ratio: 7 mcg/mg creat (ref <30)
Microalb, Ur: 1.8 mg/dL

## 2015-09-14 ENCOUNTER — Encounter: Payer: Self-pay | Admitting: *Deleted

## 2015-10-07 ENCOUNTER — Other Ambulatory Visit: Payer: Self-pay | Admitting: Internal Medicine

## 2016-01-27 ENCOUNTER — Encounter: Payer: BLUE CROSS/BLUE SHIELD | Admitting: Internal Medicine

## 2016-04-12 ENCOUNTER — Encounter: Payer: BLUE CROSS/BLUE SHIELD | Admitting: Internal Medicine

## 2016-04-16 ENCOUNTER — Encounter: Payer: Self-pay | Admitting: *Deleted

## 2016-04-16 ENCOUNTER — Encounter: Payer: Self-pay | Admitting: Internal Medicine

## 2016-04-16 ENCOUNTER — Ambulatory Visit (INDEPENDENT_AMBULATORY_CARE_PROVIDER_SITE_OTHER): Payer: BLUE CROSS/BLUE SHIELD | Admitting: Internal Medicine

## 2016-04-16 VITALS — BP 108/60 | HR 58 | Temp 98.0°F | Wt 169.0 lb

## 2016-04-16 DIAGNOSIS — Z1211 Encounter for screening for malignant neoplasm of colon: Secondary | ICD-10-CM | POA: Diagnosis not present

## 2016-04-16 DIAGNOSIS — E119 Type 2 diabetes mellitus without complications: Secondary | ICD-10-CM

## 2016-04-16 DIAGNOSIS — D508 Other iron deficiency anemias: Secondary | ICD-10-CM | POA: Diagnosis not present

## 2016-04-16 DIAGNOSIS — E785 Hyperlipidemia, unspecified: Secondary | ICD-10-CM

## 2016-04-16 DIAGNOSIS — I10 Essential (primary) hypertension: Secondary | ICD-10-CM | POA: Diagnosis not present

## 2016-04-16 DIAGNOSIS — Z0000 Encounter for general adult medical examination without abnormal findings: Secondary | ICD-10-CM

## 2016-04-16 LAB — COMPLETE METABOLIC PANEL WITH GFR
ALT: 18 U/L (ref 6–29)
AST: 13 U/L (ref 10–35)
Albumin: 4.6 g/dL (ref 3.6–5.1)
Alkaline Phosphatase: 35 U/L (ref 33–130)
BUN: 17 mg/dL (ref 7–25)
CO2: 27 mmol/L (ref 20–31)
Calcium: 9.8 mg/dL (ref 8.6–10.4)
Chloride: 104 mmol/L (ref 98–110)
Creat: 0.68 mg/dL (ref 0.50–1.05)
GFR, Est African American: >89 mL/min (ref >=60)
GFR, Est Non African American: >89 mL/min (ref >=60)
Glucose, Bld: 106 mg/dL — ABNORMAL HIGH (ref 65–99)
Potassium: 3.9 mmol/L (ref 3.5–5.3)
Sodium: 142 mmol/L (ref 135–146)
Total Bilirubin: 0.4 mg/dL (ref 0.2–1.2)
Total Protein: 7.2 g/dL (ref 6.1–8.1)

## 2016-04-16 LAB — CBC WITH DIFFERENTIAL/PLATELET
Basophils Absolute: 56 cells/uL (ref 0–200)
Basophils Relative: 1 %
Eosinophils Absolute: 112 cells/uL (ref 15–500)
Eosinophils Relative: 2 %
HCT: 37.2 % (ref 35.0–45.0)
Hemoglobin: 12.1 g/dL (ref 11.7–15.5)
Lymphocytes Relative: 39 %
Lymphs Abs: 2184 cells/uL (ref 850–3900)
MCH: 25.1 pg — ABNORMAL LOW (ref 27.0–33.0)
MCHC: 32.5 g/dL (ref 32.0–36.0)
MCV: 77.2 fL — ABNORMAL LOW (ref 80.0–100.0)
MPV: 10 fL (ref 7.5–12.5)
Monocytes Absolute: 448 cells/uL (ref 200–950)
Monocytes Relative: 8 %
Neutro Abs: 2800 cells/uL (ref 1500–7800)
Neutrophils Relative %: 50 %
Platelets: 289 10*3/uL (ref 140–400)
RBC: 4.82 MIL/uL (ref 3.80–5.10)
RDW: 16.1 % — ABNORMAL HIGH (ref 11.0–15.0)
WBC: 5.6 10*3/uL (ref 3.8–10.8)

## 2016-04-16 LAB — IRON,TIBC AND FERRITIN PANEL
%SAT: 13 % (ref 11–50)
Ferritin: 119 ng/mL (ref 10–232)
Iron: 43 ug/dL — ABNORMAL LOW (ref 45–160)
TIBC: 341 ug/dL (ref 250–450)

## 2016-04-16 LAB — LIPID PANEL
Cholesterol: 223 mg/dL — ABNORMAL HIGH (ref <200)
HDL: 45 mg/dL — ABNORMAL LOW (ref >50)
LDL Cholesterol: 138 mg/dL — ABNORMAL HIGH (ref <100)
Total CHOL/HDL Ratio: 5 Ratio — ABNORMAL HIGH (ref <5.0)
Triglycerides: 201 mg/dL — ABNORMAL HIGH (ref <150)
VLDL: 40 mg/dL — ABNORMAL HIGH (ref <30)

## 2016-04-16 LAB — HEMOGLOBIN A1C
Hgb A1c MFr Bld: 6.1 % — ABNORMAL HIGH (ref <5.7)
Mean Plasma Glucose: 128 mg/dL

## 2016-04-16 NOTE — Progress Notes (Addendum)
Provider:  Rexene Edison. Mariea Clonts, D.O., C.M.D. Location:   [facility]  Place of Service:   clinic  Previous PCP: Hollace Kinnier, DO Patient Care Team: Gayland Curry, DO as PCP - General (Geriatric Medicine) Thalia Bloodgood, OD as Referring Physician (Optometry)  Extended Emergency Contact Information Primary Emergency Contact: Barretto,William Address: [address]          [place] Phone: [phone] Mobile Phone: [phone] Relation: Spouse  Code Status: full code Goals of Care: Advanced Directive information Advanced Directives 04/16/2016  Does Patient Have a Medical Advance Directive? No  Would patient like information on creating a medical advance directive? No - Patient declined  Pre-existing out of facility DNR order (yellow form or pink MOST form) -   Chief Complaint  Patient presents with  . Annual Exam    CPE    HPI: Patient is a 51 y.o. female seen today for an annual physical exam.  Sees gyn for pap and breast exam, mammogram.  [facility].  Goes each October.    She had her cscope with Dr. Oletta Lamas at [place].  We don't have a copy.  This is b/c she was seen at the hospital but not the office and she did not have a cscope.  She says she was put to sleep for it in 2004.    DMII:  6/17 had diabetic eye exam.  Continues on metformin and tradjenta. Foot exam today. Lab Results  Component Value Date   HGBA1C 6.3 (H) 09/12/2015  Microalbumin normal in July Should have flu shot but refuses. Should have pneumonia vaccines but refuses them--says she's been lucky.  Risks explained, but continues to refuse.  Hyperlipidemia:  Last lipids in July 2017.  LDL 118, HDL 45, total 211.  On fibrate.  Could not tolerate statin therapy.  Latest liver panel wnl.  Iron deficiency anemia:  hgb wnl in July.  No weakness or fatigue.    Menopause:  Having hot flashes and not sleeping well.    Past Medical History:  Diagnosis Date  . Abdominal  pain, right lower quadrant   . Acute frontal sinusitis   . Acute upper respiratory infections of unspecified site   . Allergic rhinitis, cause unspecified   . Anemia, unspecified   . Anxiety state, unspecified   . Candidiasis of vulva and vagina   . Contact dermatitis and other eczema, due to unspecified cause   . Dizziness and giddiness   . Dysmenorrhea   . Dysuria   . Dysuria   . Edema   . Esophageal reflux   . Essential hypertension, benign   . External hemorrhoids without mention of complication   . Headache(784.0)   . Hyperpotassemia   . Hypopotassemia   . Iron deficiency anemia, unspecified   . Loss of weight   . Lumbago   . Neoplasm of uncertain behavior of skin   . Nontoxic uninodular goiter   . Other abnormal blood chemistry   . Other abnormal blood chemistry   . Other and unspecified hyperlipidemia   . Palpitations   . Pure hypercholesterolemia   . Reflux esophagitis   . Routine general medical examination at a health care facility   . Routine gynecological examination   . Shortness of breath   . Sprain of tibiofibular (ligament), distal of ankle   . Unspecified constipation   . Unspecified essential hypertension   . Unspecified pruritic disorder   . Urinary tract infection, site not specified  No past surgical history on file.  reports that she has never smoked. She has never used smokeless tobacco. She reports that she does not drink alcohol or use drugs.  Functional Status Survey:    No family history on file.  Health Maintenance  Topic Date Due  . PNEUMOCOCCAL POLYSACCHARIDE VACCINE (1) 10/16/1965  . HIV Screening  10/17/1978  . COLONOSCOPY  10/16/2013  . FOOT EXAM  01/28/2016  . HEMOGLOBIN A1C  03/14/2016  . OPHTHALMOLOGY EXAM  07/22/2016  . URINE MICROALBUMIN  09/11/2016  . MAMMOGRAM  12/16/2016  . PAP SMEAR  12/13/2017  . TETANUS/TDAP  05/13/2021  . Hepatitis C Screening  Completed    Allergies  Allergen Reactions  . Lipitor  [Atorvastatin] Other (See Comments)    Muscle cramps  . Metformin And Related Palpitations  . Pravachol [Pravastatin Sodium] Other (See Comments)    Muscle cramps in legs  . Crestor [Rosuvastatin]   . Other     Cranberries   . Penicillins     Pt was told by father  NOT to take Penicillins.  . Sulfa Antibiotics   . Vioxx [Rofecoxib]     Allergies as of 04/16/2016      Reactions   Lipitor [atorvastatin] Other (See Comments)   Muscle cramps   Metformin And Related Palpitations   Pravachol [pravastatin Sodium] Other (See Comments)   Muscle cramps in legs   Crestor [rosuvastatin]    Other    Cranberries   Penicillins    Pt was told by father  NOT to take Penicillins.   Sulfa Antibiotics    Vioxx [rofecoxib]       Medication List       Accurate as of 04/16/16  9:19 AM. Always use your most recent med list.          aspirin 81 MG EC tablet Take 1 tablet (81 mg total) by mouth daily. Swallow whole.   CINNAMON PO Take 2 tablets by mouth daily.   docusate sodium 50 MG capsule Commonly known as:  COLACE Take by mouth at bedtime. For constipation   fenofibrate 145 MG tablet Commonly known as:  TRICOR Take 1 tablet (145 mg total) by mouth daily.   fish oil-omega-3 fatty acids 1000 MG capsule Take by mouth daily.   hydrochlorothiazide 25 MG tablet Commonly known as:  HYDRODIURIL Take 1 tablet (25 mg total) by mouth daily.   linagliptin 5 MG Tabs tablet Commonly known as:  TRADJENTA Take 1 tablet (5 mg total) by mouth daily.   metFORMIN 500 MG tablet Commonly known as:  GLUCOPHAGE Take 500 mg by mouth 2 (two) times daily with a meal.   TRIGELS-F FORTE 460-60-0.01-1 MG Caps capsule Generic drug:  Fe Fum-Vit C-Vit B12-FA TAKE ONE CAPSULE EVERY DAY       Review of Systems  Constitutional: Negative for chills, fever, malaise/fatigue and weight loss.  HENT: Negative for congestion and hearing loss.   Eyes: Negative for blurred vision.  Respiratory: Negative  for cough and shortness of breath.   Cardiovascular: Negative for chest pain, palpitations and leg swelling.  Gastrointestinal: Negative for abdominal pain, blood in stool, constipation, diarrhea and melena.  Genitourinary: Negative for dysuria, frequency and urgency.  Musculoskeletal: Positive for back pain. Negative for falls, joint pain, myalgias and neck pain.  Skin: Negative for itching and rash.  Neurological: Negative for dizziness, loss of consciousness and weakness.  Endo/Heme/Allergies: Does not bruise/bleed easily.  Psychiatric/Behavioral: Negative for depression and memory loss. The patient  is not nervous/anxious and does not have insomnia.     Vitals:   04/16/16 0859  BP: 108/60  Pulse: (!) 58  Temp: 98 F (36.7 C)  TempSrc: Oral  SpO2: 97%  Weight: 169 lb (76.7 kg)   Body mass index is 26.47 kg/m. Physical Exam  Constitutional: She is oriented to person, place, and time. She appears well-developed and well-nourished. No distress.  HENT:  Head: Normocephalic and atraumatic.  Right Ear: External ear normal.  Left Ear: External ear normal.  Nose: Nose normal.  Mouth/Throat: Oropharynx is clear and moist. No oropharyngeal exudate.  Only small amt of cerumen in right ear  Eyes: Conjunctivae and EOM are normal. Pupils are equal, round, and reactive to light.  Neck: Normal range of motion. Neck supple. No JVD present. No thyromegaly present.  Cardiovascular: Normal rate, regular rhythm, normal heart sounds and intact distal pulses.   Pulmonary/Chest: Effort normal and breath sounds normal. No respiratory distress.  Abdominal: Soft. Bowel sounds are normal. She exhibits no distension and no mass. There is no tenderness. There is no rebound and no guarding. No hernia.  Musculoskeletal: Normal range of motion. She exhibits no tenderness.  Lymphadenopathy:    She has no cervical adenopathy.  Neurological: She is alert and oriented to person, place, and time. No sensory  deficit.  Foot exam done  Skin: Skin is warm and dry. Capillary refill takes less than 2 seconds.  Psychiatric: She has a normal mood and affect. Her behavior is normal. Judgment and thought content normal.    Labs reviewed: Basic Metabolic Panel:  Recent Labs  09/12/15 0910  NA 140  K 3.9  CL 102  CO2 28  GLUCOSE 101*  BUN 12  CREATININE 0.62  CALCIUM 9.5   Liver Function Tests:  Recent Labs  09/12/15 0910  AST 18  ALT 26  ALKPHOS 43  BILITOT 0.5  PROT 7.1  ALBUMIN 4.9   No results for input(s): LIPASE, AMYLASE in the last 8760 hours. No results for input(s): AMMONIA in the last 8760 hours. CBC:  Recent Labs  09/12/15 0910  WBC 5.9  NEUTROABS 3,186  HGB 13.2  HCT 40.7  MCV 77.2*  PLT 320   Cardiac Enzymes: No results for input(s): CKTOTAL, CKMB, CKMBINDEX, TROPONINI in the last 8760 hours. BNP: Invalid input(s): POCBNP Lab Results  Component Value Date   HGBA1C 6.3 (H) 09/12/2015   Lab Results  Component Value Date   TSH 2.850 06/09/2012   Lab Results  Component Value Date   [id number] 03/18/2012   Lab Results  Component Value Date   FOLATE >20.0 03/18/2012   Lab Results  Component Value Date   IRON 20 (L) 03/18/2012   TIBC 366 03/18/2012   FERRITIN 90 03/18/2012    Assessment/Plan 1. Screening for colon cancer - says she had a cscope in 2004, but GI office says no - referred to get new cscope as over 50  - would suggest cologuard, but she's had iron deficiency anemia - Ambulatory referral to Gastroenterology - CBC with Differential/Platelet - Iron, TIBC and Ferritin Panel  2. Controlled type 2 diabetes mellitus without complication, without long-term current use of insulin ([facility]) - cont tradjenta and metformin, increased veggies and fruits (also due to constipation complaint, plus hydrate better with water) - Lipid panel - Hemoglobin A1c - Lipid panel; Future - Hemoglobin A1c; Future  3. Other iron deficiency anemia - f/u  labs: - CBC with Differential/Platelet - CBC with Differential/Platelet; Future  4. Hyperlipidemia LDL goal <100 - cont fenofibrate  - Lipid panel - Lipid panel; Future  5. Essential hypertension, benign - bp at goal with current regimen - COMPLETE METABOLIC PANEL WITH GFR - Basic metabolic panel; Future  6. Annual physical exam - performed today, see hpi for updates  Labs/tests ordered:   Orders Placed This Encounter  Procedures  . CBC with Differential/Platelet  . COMPLETE METABOLIC PANEL WITH GFR    [facility]  . Lipid panel    Order Specific Question:   Has the patient fasted?    Answer:   Yes  . Hemoglobin A1c  . Iron, TIBC and Ferritin Panel  . CBC with Differential/Platelet    Standing Status:   Future    Standing Expiration Date:   04/16/2017  . Basic metabolic panel    Standing Status:   Future    Standing Expiration Date:   04/16/2017    Order Specific Question:   Has the patient fasted?    Answer:   Yes  . Lipid panel    Standing Status:   Future    Standing Expiration Date:   04/16/2017    Order Specific Question:   Has the patient fasted?    Answer:   Yes  . Hemoglobin A1c    Standing Status:   Future    Standing Expiration Date:   04/16/2017  . Ambulatory referral to Gastroenterology    Referral Priority:   Routine    Referral Type:   Consultation    Referral Reason:   Specialty Services Required    Number of Visits Requested:   1   Corbyn Wildey L. Isaiah Torok, D.O. [facility] [address], [place] [address] Cell Phone (Mon-Fri 8am-5pm):  [phone] On Call:  [phone] & follow prompts after 5pm & weekends Office Phone:  [phone] Office Fax:  [phone]

## 2016-04-26 ENCOUNTER — Other Ambulatory Visit: Payer: Self-pay | Admitting: Internal Medicine

## 2016-04-26 DIAGNOSIS — E782 Mixed hyperlipidemia: Secondary | ICD-10-CM

## 2016-05-22 ENCOUNTER — Encounter: Payer: Self-pay | Admitting: Internal Medicine

## 2016-06-23 ENCOUNTER — Other Ambulatory Visit: Payer: Self-pay | Admitting: Internal Medicine

## 2016-06-28 ENCOUNTER — Telehealth: Payer: Self-pay

## 2016-06-28 NOTE — Telephone Encounter (Signed)
[facility] and stated that [facility] and she does not want to use the mail order pharmacy they are trying to make her use. [facility] and was told that patient was given the phone number to opt out of mail order services, once she opted out, she could [facility]. The pharmacy tech stated that patient would be coming back to store today to pick up medications and when she arrived, the tech would help her to opt out of mail order services.   I tried to call patient but her mobile number is not receiving calls at this time and her husband stated that she would not be home for awhile. He will have her to call once she gets home.

## 2016-06-29 ENCOUNTER — Telehealth: Payer: Self-pay

## 2016-06-29 NOTE — Telephone Encounter (Signed)
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 For Hydrochlorothiazide medication is 4.87.

## 2016-07-01 ENCOUNTER — Other Ambulatory Visit: Payer: Self-pay | Admitting: Internal Medicine

## 2016-07-03 ENCOUNTER — Telehealth: Payer: Self-pay | Admitting: *Deleted

## 2016-07-03 NOTE — Telephone Encounter (Signed)
Received Prior Authorization from [facility] for patient's Hydrochlorothiazide. Initiated through [employer] [facility]. Key: [id number]. Will received determination in 1-5 business days.  ID#: [id number]

## 2016-08-14 LAB — HM COLONOSCOPY

## 2016-10-11 ENCOUNTER — Other Ambulatory Visit: Payer: BLUE CROSS/BLUE SHIELD

## 2016-10-11 DIAGNOSIS — I10 Essential (primary) hypertension: Secondary | ICD-10-CM

## 2016-10-11 DIAGNOSIS — D508 Other iron deficiency anemias: Secondary | ICD-10-CM

## 2016-10-11 DIAGNOSIS — E119 Type 2 diabetes mellitus without complications: Secondary | ICD-10-CM

## 2016-10-11 DIAGNOSIS — E785 Hyperlipidemia, unspecified: Secondary | ICD-10-CM

## 2016-10-11 LAB — CBC WITH DIFFERENTIAL/PLATELET
Basophils Absolute: 58 cells/uL (ref 0–200)
Basophils Relative: 1 %
Eosinophils Absolute: 116 cells/uL (ref 15–500)
Eosinophils Relative: 2 %
HCT: 39.1 % (ref 35.0–45.0)
Hemoglobin: 12.9 g/dL (ref 11.7–15.5)
Lymphocytes Relative: 38 %
Lymphs Abs: 2204 cells/uL (ref 850–3900)
MCH: 25.7 pg — ABNORMAL LOW (ref 27.0–33.0)
MCHC: 33 g/dL (ref 32.0–36.0)
MCV: 78 fL — ABNORMAL LOW (ref 80.0–100.0)
MPV: 10.6 fL (ref 7.5–12.5)
Monocytes Absolute: 464 cells/uL (ref 200–950)
Monocytes Relative: 8 %
Neutro Abs: 2958 cells/uL (ref 1500–7800)
Neutrophils Relative %: 51 %
Platelets: 315 10*3/uL (ref 140–400)
RBC: 5.01 MIL/uL (ref 3.80–5.10)
RDW: 15.6 % — ABNORMAL HIGH (ref 11.0–15.0)
WBC: 5.8 10*3/uL (ref 3.8–10.8)

## 2016-10-11 LAB — BASIC METABOLIC PANEL
BUN: 16 mg/dL (ref 7–25)
CO2: 28 mmol/L (ref 20–32)
Calcium: 9.9 mg/dL (ref 8.6–10.4)
Chloride: 103 mmol/L (ref 98–110)
Creat: 0.73 mg/dL (ref 0.50–1.05)
Glucose, Bld: 99 mg/dL (ref 65–99)
Potassium: 4.1 mmol/L (ref 3.5–5.3)
Sodium: 140 mmol/L (ref 135–146)

## 2016-10-11 LAB — LIPID PANEL
Cholesterol: 225 mg/dL — ABNORMAL HIGH (ref <200)
HDL: 47 mg/dL — ABNORMAL LOW (ref >50)
LDL Cholesterol: 144 mg/dL — ABNORMAL HIGH (ref <100)
Total CHOL/HDL Ratio: 4.8 Ratio (ref <5.0)
Triglycerides: 171 mg/dL — ABNORMAL HIGH (ref <150)
VLDL: 34 mg/dL — ABNORMAL HIGH (ref <30)

## 2016-10-12 LAB — HEMOGLOBIN A1C
Hgb A1c MFr Bld: 5.8 % — ABNORMAL HIGH (ref <5.7)
Mean Plasma Glucose: 120 mg/dL

## 2016-10-15 ENCOUNTER — Encounter: Payer: Self-pay | Admitting: Internal Medicine

## 2016-10-15 ENCOUNTER — Ambulatory Visit (INDEPENDENT_AMBULATORY_CARE_PROVIDER_SITE_OTHER): Payer: BLUE CROSS/BLUE SHIELD | Admitting: Internal Medicine

## 2016-10-15 VITALS — BP 120/70 | HR 60 | Temp 97.9°F | Ht 67.0 in | Wt 159.0 lb

## 2016-10-15 DIAGNOSIS — D508 Other iron deficiency anemias: Secondary | ICD-10-CM | POA: Diagnosis not present

## 2016-10-15 DIAGNOSIS — E1169 Type 2 diabetes mellitus with other specified complication: Secondary | ICD-10-CM | POA: Diagnosis not present

## 2016-10-15 DIAGNOSIS — I10 Essential (primary) hypertension: Secondary | ICD-10-CM

## 2016-10-15 DIAGNOSIS — E119 Type 2 diabetes mellitus without complications: Secondary | ICD-10-CM

## 2016-10-15 DIAGNOSIS — E785 Hyperlipidemia, unspecified: Secondary | ICD-10-CM

## 2016-10-15 NOTE — Progress Notes (Signed)
Location:  [facility] clinic Provider:  Tharon Bomar L. Mariea Clonts, D.O., C.M.D.  Code Status: full code Goals of Care:  Advanced Directives 04/16/2016  Does Patient Have a Medical Advance Directive? No  Would patient like information on creating a medical advance directive? No - Patient declined  Pre-existing out of facility DNR order (yellow form or pink MOST form) -   Chief Complaint  Patient presents with  . Medical Management of Chronic Issues    65mth follow-up    HPI: Patient is a 51 y.o. female seen today for medical management of chronic diseases.    Hyperlipidemia:  Says when she took fenofibrate, she couldn't hardly pee (saw in side effects) so stopped it after a month.  Has to make her eye exam appt.  She is agreeable to trying repatha shots.  She has failed lipitor, crestor, and pravachol statins due to muscle cramps and fenofibrate due to the urinary difficulty.  Her LDL remains 144 and she's diabetic.  Her total cholesterol is 225 with HDL only 47.  Ratio has been over 5 at times.  Diet is working for glucose.  Stopped taking the metformin and tradjenta apparently long ago based on refill dates.    Had cscope with Dr. Oletta Lamas in June. Normal in June so next due in 2028.   BP at goal.  HCTZ is effective.  She is actually taking it.  Iron deficiency anemia:  hgb improved on iron.  Takes colace also.   Past Medical History:  Diagnosis Date  . Abdominal pain, right lower quadrant   . Acute frontal sinusitis   . Acute upper respiratory infections of unspecified site   . Allergic rhinitis, cause unspecified   . Anemia, unspecified   . Anxiety state, unspecified   . Candidiasis of vulva and vagina   . Contact dermatitis and other eczema, due to unspecified cause   . Dizziness and giddiness   . Dysmenorrhea   . Dysuria   . Dysuria   . Edema   . Esophageal reflux   . Essential hypertension, benign   . External hemorrhoids without mention of complication   . Headache(784.0)   .  Hyperpotassemia   . Hypopotassemia   . Iron deficiency anemia, unspecified   . Loss of weight   . Lumbago   . Neoplasm of uncertain behavior of skin   . Nontoxic uninodular goiter   . Other abnormal blood chemistry   . Other abnormal blood chemistry   . Other and unspecified hyperlipidemia   . Palpitations   . Pure hypercholesterolemia   . Reflux esophagitis   . Routine general medical examination at a health care facility   . Routine gynecological examination   . Shortness of breath   . Sprain of tibiofibular (ligament), distal of ankle   . Unspecified constipation   . Unspecified essential hypertension   . Unspecified pruritic disorder   . Urinary tract infection, site not specified     No past surgical history on file.  Allergies  Allergen Reactions  . Lipitor [Atorvastatin] Other (See Comments)    Muscle cramps  . Metformin And Related Palpitations  . Pravachol [Pravastatin Sodium] Other (See Comments)    Muscle cramps in legs  . Crestor [Rosuvastatin]   . Other     Cranberries   . Penicillins     Pt was told by father  NOT to take Penicillins.  . Sulfa Antibiotics   . Vioxx [Rofecoxib]     Allergies as of 10/15/2016  Reactions   Lipitor [atorvastatin] Other (See Comments)   Muscle cramps   Metformin And Related Palpitations   Pravachol [pravastatin Sodium] Other (See Comments)   Muscle cramps in legs   Crestor [rosuvastatin]    Other    Cranberries   Penicillins    Pt was told by father  NOT to take Penicillins.   Sulfa Antibiotics    Vioxx [rofecoxib]       Medication List       Accurate as of 10/15/16  8:41 AM. Always use your most recent med list.          aspirin 81 MG EC tablet Take 1 tablet (81 mg total) by mouth daily. Swallow whole.   CINNAMON PO Take 2 tablets by mouth daily.   docusate sodium 50 MG capsule Commonly known as:  COLACE Take by mouth at bedtime. For constipation   fenofibrate 145 MG tablet Commonly known as:   TRICOR TAKE 1 TABLET (145 MG TOTAL) BY MOUTH DAILY.   fish oil-omega-3 fatty acids 1000 MG capsule Take by mouth daily.   hydrochlorothiazide 25 MG tablet Commonly known as:  HYDRODIURIL TAKE 1 TABLET (25 MG TOTAL) BY MOUTH DAILY.   linagliptin 5 MG Tabs tablet Commonly known as:  TRADJENTA Take 1 tablet (5 mg total) by mouth daily.   metFORMIN 500 MG tablet Commonly known as:  GLUCOPHAGE Take 500 mg by mouth 2 (two) times daily with a meal.   TRIGELS-F FORTE 460-60-0.01-1 MG Caps capsule Generic drug:  Fe Fum-Vit C-Vit B12-FA TAKE ONE CAPSULE EVERY DAY       Review of Systems:  Review of Systems  Constitutional: Negative for chills, fever and malaise/fatigue.  HENT: Negative for congestion and hearing loss.   Eyes: Negative for blurred vision.  Respiratory: Negative for cough and shortness of breath.   Cardiovascular: Negative for chest pain and palpitations.  Gastrointestinal: Positive for constipation. Negative for abdominal pain, blood in stool, diarrhea and melena.  Genitourinary: Negative for dysuria, frequency and urgency.  Musculoskeletal: Negative for falls and myalgias.  Skin: Negative for itching and rash.  Neurological: Negative for dizziness, loss of consciousness and weakness.  Endo/Heme/Allergies: Does not bruise/bleed easily.  Psychiatric/Behavioral: Negative for depression and memory loss. The patient does not have insomnia.     Health Maintenance  Topic Date Due  . HIV Screening  10/17/1978  . OPHTHALMOLOGY EXAM  07/22/2016  . URINE MICROALBUMIN  09/11/2016  . PNEUMOCOCCAL POLYSACCHARIDE VACCINE (1) 04/15/2021 (Originally 10[birth year]16/1965)  . MAMMOGRAM  12/16/2016  . HEMOGLOBIN A1C  04/13/2017  . FOOT EXAM  04/16/2017  . PAP SMEAR  12/13/2017  . TETANUS/TDAP  05/13/2021  . COLONOSCOPY  08/15/2026  . Hepatitis C Screening  Completed    Physical Exam: Vitals:   10/15/16 0838  BP: 120/70  Pulse: 60  Temp: 97.9 F (36.6 C)  TempSrc: Oral  SpO2:  98%  Weight: 159 lb (72.1 kg)  Height: 5\' 7"  (1.702 m)   Body mass index is 24.9 kg/m. Physical Exam  Constitutional: She is oriented to person, place, and time. She appears well-developed and well-nourished. No distress.  HENT:  Head: Normocephalic and atraumatic.  Cardiovascular: Normal rate, regular rhythm, normal heart sounds and intact distal pulses.   Pulmonary/Chest: Effort normal and breath sounds normal. No respiratory distress.  Abdominal: Soft. Bowel sounds are normal.  Musculoskeletal: Normal range of motion.  Neurological: She is alert and oriented to person, place, and time.  Skin: Skin is warm and dry. There  is pallor.  Psychiatric: She has a normal mood and affect.    Labs reviewed: Basic Metabolic Panel:  Recent Labs  04/16/16 0952 10/11/16 0812  NA 142 140  K 3.9 4.1  CL 104 103  CO2 27 28  GLUCOSE 106* 99  BUN 17 16  CREATININE 0.68 0.73  CALCIUM 9.8 9.9   Liver Function Tests:  Recent Labs  04/16/16 0952  AST 13  ALT 18  ALKPHOS 35  BILITOT 0.4  PROT 7.2  ALBUMIN 4.6   No results for input(s): LIPASE, AMYLASE in the last 8760 hours. No results for input(s): AMMONIA in the last 8760 hours. CBC:  Recent Labs  04/16/16 0952 10/11/16 0812  WBC 5.6 5.8  NEUTROABS 2,800 2,958  HGB 12.1 12.9  HCT 37.2 39.1  MCV 77.2* 78.0*  PLT 289 315   Lipid Panel:  Recent Labs  04/16/16 0952 10/11/16 0812  CHOL 223* 225*  HDL 45* 47*  LDLCALC 138* 144*  TRIG 201* 171*  CHOLHDL 5.0* 4.8   Lab Results  Component Value Date   HGBA1C 5.8 (H) 10/11/2016    Procedures since last visit: Cscope from 6/18 reviewed.    Assessment/Plan 1. Other iron deficiency anemia -stable, cont iron supplement and monitor h/h  2. Hyperlipidemia LDL goal <70 associated with diabetes -lipids well above goal and pt does not tolerate statins or fenofibrate -will try her on repatha -she is wiling to try it  -we discussed that with her diabetes and elevated  LDL, she is at high risk for stroke or heart attack -suspect genetic component with low hdl  3. Controlled type 2 diabetes mellitus without complication, without long-term current use of insulin ([place]) -is controlled with diet only (she stopped metformin and tradjenta on her own)  4. Essential hypertension, benign -bp at goal with hctz, cont same and monitor  Labs/tests ordered:  Orders Placed This Encounter  Procedures  . CBC with Differential/Platelet    Standing Status:   Future    Standing Expiration Date:   06/15/2017  . COMPLETE METABOLIC PANEL WITH GFR    Standing Status:   Future    Standing Expiration Date:   06/15/2017  . Hemoglobin A1c    Standing Status:   Future    Standing Expiration Date:   06/15/2017  . Lipid panel    Standing Status:   Future    Standing Expiration Date:   06/15/2017  . Microalbumin / creatinine urine ratio    Standing Status:   Future    Standing Expiration Date:   06/15/2017    Next appt:  4 mos med mgt, labs before  Roger Fasnacht L. Danniela Mcbrearty, D.O. [facility] [address], [place] [address] Cell Phone (Mon-Fri 8am-5pm):  [phone] On Call:  [phone] & follow prompts after 5pm & weekends Office Phone:  [phone] Office Fax:  [phone]

## 2016-10-25 ENCOUNTER — Other Ambulatory Visit: Payer: Self-pay | Admitting: *Deleted

## 2016-10-25 NOTE — Telephone Encounter (Signed)
Filled out Repatha form and faxed to [employer] (original in my mailbox)

## 2016-11-05 ENCOUNTER — Other Ambulatory Visit: Payer: Self-pay | Admitting: *Deleted

## 2016-11-05 MED ORDER — EVOLOCUMAB WITH INFUSOR 420 MG/3.5ML ~~LOC~~ SOCT: 3.5000 mL | SUBCUTANEOUS | 5 refills | Status: DC

## 2016-11-05 NOTE — Telephone Encounter (Signed)
Sharmae with [phone] Rx called and stated that they received a Rx from [facility] for patient to get medication but due to insurance it has to go through them. Needs new Rx faxed to them. Reviewed last OV note and faxed Rx.

## 2016-12-27 ENCOUNTER — Telehealth: Payer: Self-pay

## 2016-12-27 ENCOUNTER — Other Ambulatory Visit: Payer: Self-pay | Admitting: Internal Medicine

## 2016-12-27 NOTE — Telephone Encounter (Signed)
I called patient to discuss new information on her insurance plan regarding Repatha. Patient's insurance does cover any type of specialty medications.   After speaking with Jola Schmidt from [place], we were informed to send in new patient safety net foundation to see if patient will apply for free Repatha.   Patient will need to complete a new application for safety net. I left a message for patient to call the office.

## 2017-01-16 NOTE — Telephone Encounter (Signed)
I called patient. A man answered the phone and stated that patient is unavailable. I asked that patient call the office to discuss repatha.

## 2017-01-22 NOTE — Telephone Encounter (Signed)
I called patient but was told that pt is unavailable. I asked that patient call the office to discuss completing Repatha safety net application.   Pt insurance does not cover speciality drugs at all, so patient will most likely be covered by safety net at no cost.   Pt needs to complete new safety net application

## 2017-01-23 NOTE — Telephone Encounter (Signed)
Pt is hard to contact, will discuss at her next appt on 02/07/17

## 2017-02-05 ENCOUNTER — Other Ambulatory Visit: Payer: BLUE CROSS/BLUE SHIELD

## 2017-02-05 DIAGNOSIS — D509 Iron deficiency anemia, unspecified: Secondary | ICD-10-CM | POA: Diagnosis not present

## 2017-02-07 ENCOUNTER — Ambulatory Visit: Payer: BLUE CROSS/BLUE SHIELD | Admitting: Internal Medicine

## 2017-03-05 ENCOUNTER — Other Ambulatory Visit: Payer: BLUE CROSS/BLUE SHIELD

## 2017-03-05 DIAGNOSIS — D509 Iron deficiency anemia, unspecified: Secondary | ICD-10-CM | POA: Diagnosis not present

## 2017-03-07 ENCOUNTER — Ambulatory Visit: Payer: BLUE CROSS/BLUE SHIELD | Admitting: Internal Medicine

## 2017-03-16 ENCOUNTER — Other Ambulatory Visit: Payer: Self-pay | Admitting: Internal Medicine

## 2017-03-25 NOTE — Addendum Note (Signed)
Addended by: Lurena Nida on: 03/25/2017 09:24 AM   Modules accepted: Orders

## 2017-03-28 ENCOUNTER — Other Ambulatory Visit: Payer: BLUE CROSS/BLUE SHIELD

## 2017-03-28 DIAGNOSIS — E119 Type 2 diabetes mellitus without complications: Secondary | ICD-10-CM

## 2017-03-28 DIAGNOSIS — E1169 Type 2 diabetes mellitus with other specified complication: Secondary | ICD-10-CM

## 2017-03-28 DIAGNOSIS — E785 Hyperlipidemia, unspecified: Secondary | ICD-10-CM

## 2017-03-28 DIAGNOSIS — D508 Other iron deficiency anemias: Secondary | ICD-10-CM

## 2017-03-28 DIAGNOSIS — I10 Essential (primary) hypertension: Secondary | ICD-10-CM

## 2017-03-29 ENCOUNTER — Other Ambulatory Visit: Payer: BLUE CROSS/BLUE SHIELD

## 2017-03-29 LAB — CBC WITH DIFFERENTIAL/PLATELET
Basophils Absolute: 50 cells/uL (ref 0–200)
Basophils Relative: 1 %
Eosinophils Absolute: 100 cells/uL (ref 15–500)
Eosinophils Relative: 2 %
HCT: 40.5 % (ref 35.0–45.0)
Hemoglobin: 13.1 g/dL (ref 11.7–15.5)
Lymphs Abs: 1955 cells/uL (ref 850–3900)
MCH: 25.8 pg — ABNORMAL LOW (ref 27.0–33.0)
MCHC: 32.3 g/dL (ref 32.0–36.0)
MCV: 79.7 fL — ABNORMAL LOW (ref 80.0–100.0)
MPV: 10.8 fL (ref 7.5–12.5)
Monocytes Relative: 7.4 %
Neutro Abs: 2525 cells/uL (ref 1500–7800)
Neutrophils Relative %: 50.5 %
Platelets: 303 10*3/uL (ref 140–400)
RBC: 5.08 10*6/uL (ref 3.80–5.10)
RDW: 14.3 % (ref 11.0–15.0)
Total Lymphocyte: 39.1 %
WBC mixed population: 370 cells/uL (ref 200–950)
WBC: 5 10*3/uL (ref 3.8–10.8)

## 2017-03-29 LAB — COMPLETE METABOLIC PANEL WITH GFR
AG Ratio: 1.8 (calc) (ref 1.0–2.5)
ALT: 17 U/L (ref 6–29)
AST: 13 U/L (ref 10–35)
Albumin: 4.8 g/dL (ref 3.6–5.1)
Alkaline phosphatase (APISO): 44 U/L (ref 33–130)
BUN: 16 mg/dL (ref 7–25)
CO2: 29 mmol/L (ref 20–32)
Calcium: 9.8 mg/dL (ref 8.6–10.4)
Chloride: 103 mmol/L (ref 98–110)
Creat: 0.66 mg/dL (ref 0.50–1.05)
GFR, Est African American: 117 mL/min/{1.73_m2} (ref >=60)
GFR, Est Non African American: 101 mL/min/{1.73_m2} (ref >=60)
Globulin: 2.7 g/dL (calc) (ref 1.9–3.7)
Glucose, Bld: 100 mg/dL — ABNORMAL HIGH (ref 65–99)
Potassium: 3.8 mmol/L (ref 3.5–5.3)
Sodium: 141 mmol/L (ref 135–146)
Total Bilirubin: 0.5 mg/dL (ref 0.2–1.2)
Total Protein: 7.5 g/dL (ref 6.1–8.1)

## 2017-03-29 LAB — MICROALBUMIN / CREATININE URINE RATIO
Creatinine, Urine: 157 mg/dL (ref 20–275)
Microalb Creat Ratio: 9 mcg/mg creat (ref <30)
Microalb, Ur: 1.4 mg/dL

## 2017-03-29 LAB — LIPID PANEL
Cholesterol: 244 mg/dL — ABNORMAL HIGH (ref <200)
HDL: 50 mg/dL — ABNORMAL LOW (ref >50)
LDL Cholesterol (Calc): 162 mg/dL (calc) — ABNORMAL HIGH
Non-HDL Cholesterol (Calc): 194 mg/dL (calc) — ABNORMAL HIGH (ref <130)
Total CHOL/HDL Ratio: 4.9 (calc) (ref <5.0)
Triglycerides: 172 mg/dL — ABNORMAL HIGH (ref <150)

## 2017-03-29 LAB — HEMOGLOBIN A1C
Hgb A1c MFr Bld: 5.9 % of total Hgb — ABNORMAL HIGH (ref <5.7)
Mean Plasma Glucose: 123 (calc)
eAG (mmol/L): 6.8 (calc)

## 2017-04-01 ENCOUNTER — Encounter: Payer: Self-pay | Admitting: Internal Medicine

## 2017-04-01 ENCOUNTER — Ambulatory Visit: Payer: BLUE CROSS/BLUE SHIELD | Admitting: Internal Medicine

## 2017-04-01 VITALS — BP 120/80 | HR 70 | Temp 98.3°F | Wt 163.0 lb

## 2017-04-01 DIAGNOSIS — D508 Other iron deficiency anemias: Secondary | ICD-10-CM

## 2017-04-01 DIAGNOSIS — I10 Essential (primary) hypertension: Secondary | ICD-10-CM

## 2017-04-01 DIAGNOSIS — E785 Hyperlipidemia, unspecified: Secondary | ICD-10-CM

## 2017-04-01 DIAGNOSIS — E1169 Type 2 diabetes mellitus with other specified complication: Secondary | ICD-10-CM

## 2017-04-01 DIAGNOSIS — E119 Type 2 diabetes mellitus without complications: Secondary | ICD-10-CM

## 2017-04-01 DIAGNOSIS — M79674 Pain in right toe(s): Secondary | ICD-10-CM | POA: Diagnosis not present

## 2017-04-01 MED ORDER — EZETIMIBE 10 MG PO TABS: 10.0000 mg | ORAL_TABLET | Freq: Every day | ORAL | 3 refills | Status: DC

## 2017-04-01 NOTE — Patient Instructions (Addendum)
Eat plenty of leafy green veggies. Cut out the cube steak.  Eat more chicken!  :)   Also avoid fries, cheeses, chips.   Go for lean meats and whole grain bread products.   Fat and Cholesterol Restricted Diet Getting too much fat and cholesterol in your diet may cause health problems. Following this diet helps keep your fat and cholesterol at normal levels. This can keep you from getting sick. What types of fat should I choose?  Choose monosaturated and polyunsaturated fats. These are found in foods such as olive oil, canola oil, flaxseeds, walnuts, almonds, and seeds.  Eat more omega-3 fats. Good choices include salmon, mackerel, sardines, tuna, flaxseed oil, and ground flaxseeds.  Limit saturated fats. These are in animal products such as meats, butter, and cream. They can also be in plant products such as palm oil, palm kernel oil, and coconut oil.  Avoid foods with partially hydrogenated oils in them. These contain trans fats. Examples of foods that have trans fats are stick margarine, some tub margarines, cookies, crackers, and other baked goods. What general guidelines do I need to follow?  Check food labels. Look for the words "trans fat" and "saturated fat."  When preparing a meal: ? Fill half of your plate with vegetables and green salads. ? Fill one fourth of your plate with whole grains. Look for the word "whole" as the first word in the ingredient list. ? Fill one fourth of your plate with lean protein foods.  Eat more foods that have fiber, like apples, carrots, beans, peas, and barley.  Eat more home-cooked foods. Eat less at restaurants and buffets.  Limit or avoid alcohol.  Limit foods high in starch and sugar.  Limit fried foods.  Cook foods without frying them. Baking, boiling, grilling, and broiling are all great options.  Lose weight if you are overweight. Losing even a small amount of weight can help your overall health. It can also help prevent diseases such  as diabetes and heart disease. What foods can I eat? Grains Whole grains, such as whole wheat or whole grain breads, crackers, cereals, and pasta. Unsweetened oatmeal, bulgur, barley, quinoa, or brown rice. Corn or whole wheat flour tortillas. Vegetables Fresh or frozen vegetables (raw, steamed, roasted, or grilled). Green salads. Fruits All fresh, canned (in natural juice), or frozen fruits. Meat and Other Protein Products Ground beef (85% or leaner), grass-fed beef, or beef trimmed of fat. Skinless chicken or [place]. Ground chicken or [place]. Pork trimmed of fat. All fish and seafood. Eggs. Dried beans, peas, or lentils. Unsalted nuts or seeds. Unsalted canned or dry beans. Dairy Low-fat dairy products, such as skim or 1% milk, 2% or reduced-fat cheeses, low-fat ricotta or cottage cheese, or plain low-fat yogurt. Fats and Oils Tub margarines without trans fats. Light or reduced-fat mayonnaise and salad dressings. Avocado. Olive, canola, sesame, or safflower oils. Natural peanut or almond butter (choose ones without added sugar and oil). The items listed above may not be a complete list of recommended foods or beverages. Contact your dietitian for more options. What foods are not recommended? Grains White bread. White pasta. White rice. Cornbread. Bagels, pastries, and croissants. Crackers that contain trans fat. Vegetables White potatoes. Corn. Creamed or fried vegetables. Vegetables in a cheese sauce. Fruits Dried fruits. Canned fruit in light or heavy syrup. Fruit juice. Meat and Other Protein Products Fatty cuts of meat. Ribs, chicken wings, bacon, sausage, bologna, salami, chitterlings, fatback, hot dogs, bratwurst, and packaged luncheon meats. Liver and  organ meats. Dairy Whole or 2% milk, cream, half-and-half, and cream cheese. Whole milk cheeses. Whole-fat or sweetened yogurt. Full-fat cheeses. Nondairy creamers and whipped toppings. Processed cheese, cheese spreads, or cheese  curds. Sweets and Desserts Corn syrup, sugars, honey, and molasses. Candy. Jam and jelly. Syrup. Sweetened cereals. Cookies, pies, cakes, donuts, muffins, and ice cream. Fats and Oils Butter, stick margarine, lard, shortening, ghee, or bacon fat. Coconut, palm kernel, or palm oils. Beverages Alcohol. Sweetened drinks (such as sodas, lemonade, and fruit drinks or punches). The items listed above may not be a complete list of foods and beverages to avoid. Contact your dietitian for more information. This information is not intended to replace advice given to you by your health care provider. Make sure you discuss any questions you have with your health care provider. Document Released: 08/07/2011 Document Revised: 10/13/2015 Document Reviewed: 05/07/2013 Elsevier Interactive Patient Education  [employer].

## 2017-04-01 NOTE — Progress Notes (Signed)
Location:  [facility] clinic Provider:  Kaliann Coryell L. Mariea Clonts, D.O., C.M.D.  Code Status: full code Goals of Care:  Advanced Directives 04/01/2017  Does Patient Have a Medical Advance Directive? No  Would patient like information on creating a medical advance directive? No - Patient declined  Pre-existing out of facility DNR order (yellow form or pink MOST form) -   Chief Complaint  Patient presents with  . Medical Management of Chronic Issues    11mth follow-up    HPI: Patient is a 52 y.o. female seen today for medical management of chronic diseases.    Cube steak last few days, but mostly baked or grilled chicken, salads, frozen veggies that are microwaveable, fruits.  Did have pizza for superbowl.  Seems to be genetic--other family members all with high cholesterol.    Sugars remaining in prediabetic range.   Works a lot and does not do specific exercise.    She bumped her right 4th toe on a table a few weeks ago and it's been swollen, but she is able to bend it and pain has gradually improved.    She is no longer anemic on labs with regular iron.  Previously when we stopped this it dropped low.  Past Medical History:  Diagnosis Date  . Abdominal pain, right lower quadrant   . Acute frontal sinusitis   . Acute upper respiratory infections of unspecified site   . Allergic rhinitis, cause unspecified   . Anemia, unspecified   . Anxiety state, unspecified   . Candidiasis of vulva and vagina   . Contact dermatitis and other eczema, due to unspecified cause   . Dizziness and giddiness   . Dysmenorrhea   . Dysuria   . Dysuria   . Edema   . Esophageal reflux   . Essential hypertension, benign   . External hemorrhoids without mention of complication   . Headache(784.0)   . Hyperpotassemia   . Hypopotassemia   . Iron deficiency anemia, unspecified   . Loss of weight   . Lumbago   . Neoplasm of uncertain behavior of skin   . Nontoxic uninodular goiter   . Other abnormal blood  chemistry   . Other abnormal blood chemistry   . Other and unspecified hyperlipidemia   . Palpitations   . Pure hypercholesterolemia   . Reflux esophagitis   . Routine general medical examination at a health care facility   . Routine gynecological examination   . Shortness of breath   . Sprain of tibiofibular (ligament), distal of ankle   . Unspecified constipation   . Unspecified essential hypertension   . Unspecified pruritic disorder   . Urinary tract infection, site not specified     No past surgical history on file.  Allergies  Allergen Reactions  . Fenofibrate Other (See Comments)    Only took for one month after she developed difficulty urinating  . Crestor [Rosuvastatin] Other (See Comments)    Muscle cramps  . Lipitor [Atorvastatin] Other (See Comments)    Muscle cramps  . Metformin And Related Palpitations  . Pravachol [Pravastatin Sodium] Other (See Comments)    Muscle cramps in legs  . Other     Cranberries   . Penicillins     Pt was told by father  NOT to take Penicillins.  . Sulfa Antibiotics   . Vioxx [Rofecoxib]     Outpatient Encounter Medications as of 04/01/2017  Medication Sig  . aspirin 81 MG EC tablet Take 1 tablet (81 mg  total) by mouth daily. Swallow whole.  Marland Kitchen CINNAMON PO Take 2 tablets by mouth daily.  Marland Kitchen docusate sodium (COLACE) 50 MG capsule Take by mouth at bedtime. For constipation  . Evolocumab with Infusor ([place]) 420 MG/3.5ML SOCT Inject 3.5 mLs into the skin every 30 (thirty) days.  . fish oil-omega-3 fatty acids 1000 MG capsule Take by mouth daily.  . hydrochlorothiazide (HYDRODIURIL) 25 MG tablet TAKE 1 TABLET (25 MG TOTAL) BY MOUTH DAILY.  Marland Kitchen TRIGELS-F FORTE 460-60-0.01-1 MG CAPS capsule TAKE ONE CAPSULE EVERY DAY   No facility-administered encounter medications on file as of 04/01/2017.     Review of Systems:  Review of Systems  Constitutional: Negative for chills, fever and malaise/fatigue.  HENT: Negative for  congestion and hearing loss.   Eyes: Negative for blurred vision.  Respiratory: Negative for cough and shortness of breath.   Cardiovascular: Negative for chest pain and palpitations.  Gastrointestinal: Negative for abdominal pain, blood in stool, constipation and melena.  Genitourinary: Negative for dysuria.  Musculoskeletal: Negative for falls and joint pain.       Sore swollen 4th right toe  Skin: Negative for itching and rash.  Neurological: Negative for dizziness, loss of consciousness and weakness.  Endo/Heme/Allergies: Does not bruise/bleed easily.  Psychiatric/Behavioral: Negative for depression and memory loss. The patient is not nervous/anxious and does not have insomnia.     Health Maintenance  Topic Date Due  . HIV Screening  10/17/1978  . OPHTHALMOLOGY EXAM  07/22/2016  . MAMMOGRAM  12/16/2016  . PNEUMOCOCCAL POLYSACCHARIDE VACCINE (1) 04/15/2021 (Originally 10[birth year]16/1965)  . FOOT EXAM  04/16/2017  . HEMOGLOBIN A1C  09/25/2017  . PAP SMEAR  12/13/2017  . URINE MICROALBUMIN  03/28/2018  . TETANUS/TDAP  05/13/2021  . COLONOSCOPY  08/15/2026  . Hepatitis C Screening  Completed    Physical Exam: Vitals:   04/01/17 1501  BP: 120/80  Pulse: 70  Temp: 98.3 F (36.8 C)  TempSrc: Oral  SpO2: 98%  Weight: 163 lb (73.9 kg)   Body mass index is 25.53 kg/m. Physical Exam  Constitutional: She is oriented to person, place, and time. She appears well-developed and well-nourished. No distress.  Cardiovascular: Normal rate, regular rhythm, normal heart sounds and intact distal pulses.  Pulmonary/Chest: Effort normal and breath sounds normal. No respiratory distress.  Abdominal: Bowel sounds are normal.  Musculoskeletal: Normal range of motion.  Neurological: She is alert and oriented to person, place, and time.  Skin: Skin is warm and dry. Capillary refill takes less than 2 seconds.  Psychiatric: She has a normal mood and affect.    Labs reviewed: Basic Metabolic  Panel: Recent Labs    04/16/16 0952 10/11/16 0812 03/28/17 0807  NA 142 140 141  K 3.9 4.1 3.8  CL 104 103 103  CO2 27 28 29   GLUCOSE 106* 99 100*  BUN 17 16 16   CREATININE 0.68 0.73 0.66  CALCIUM 9.8 9.9 9.8   Liver Function Tests: Recent Labs    04/16/16 0952 03/28/17 0807  AST 13 13  ALT 18 17  ALKPHOS 35  --   BILITOT 0.4 0.5  PROT 7.2 7.5  ALBUMIN 4.6  --    No results for input(s): LIPASE, AMYLASE in the last 8760 hours. No results for input(s): AMMONIA in the last 8760 hours. CBC: Recent Labs    04/16/16 0952 10/11/16 0812 03/28/17 0807  WBC 5.6 5.8 5.0  NEUTROABS 2,800 2,958 2,525  HGB 12.1 12.9 13.1  HCT 37.2 39.1  40.5  MCV 77.2* 78.0* 79.7*  PLT 289 315 303   Lipid Panel: Recent Labs    04/16/16 0952 10/11/16 0812 03/28/17 0807  CHOL 223* 225* 244*  HDL 45* 47* 50*  LDLCALC 138* 144*  --   TRIG 201* 171* 172*  CHOLHDL 5.0* 4.8 4.9   Lab Results  Component Value Date   HGBA1C 5.9 (H) 03/28/2017    Assessment/Plan 1. Hyperlipidemia associated with type 2 diabetes mellitus ([place]) - counseled again on dietary changes to lower cholesterol--she has improved her diet considerably, but LDL has remained very high; we have tried several statins which she has not tolerated due to muscle pains  - will try zetia since cost of repatha was supposed to be $2000 stg even with the drug program support?  Reps have been saying it should be $160 so not understanding this - pt did not want to go to lipid clinic at this time--wants to try zetia first - ezetimibe (ZETIA) 10 MG tablet; Take 1 tablet (10 mg total) by mouth daily.  Dispense: 90 tablet; Refill: 3 - Lipid panel; Future - Basic metabolic panel; Future  2. Controlled type 2 diabetes mellitus without complication, without long-term current use of insulin ([place]) -controlled into the prediabetic range, cont to work on diet, try to exercise more - Hemoglobin A1c; Future  3. Essential hypertension,  benign -bp at goal with current regimen, cont same hctz - Basic metabolic panel; Future  4. Other iron deficiency anemia -cont iron--h/h at goal  5. Toe pain, right -due to injury, improving gradually--elevate and ice  Labs/tests ordered:  Orders Placed This Encounter  Procedures  . Hemoglobin A1c    Standing Status:   Future    Standing Expiration Date:   11/29/2017  . Lipid panel    Standing Status:   Future    Standing Expiration Date:   11/29/2017    Order Specific Question:   Has the patient fasted?    Answer:   Yes  . Basic metabolic panel    Standing Status:   Future    Standing Expiration Date:   11/29/2017    Order Specific Question:   Has the patient fasted?    Answer:   Yes   Next appt:  08/05/2017 med mgt, labs before  Jas Betten L. Aulani Shipton, D.O. [facility] [address], [place] [address] Cell Phone (Mon-Fri 8am-5pm):  [phone] On Call:  [phone] & follow prompts after 5pm & weekends Office Phone:  [phone] Office Fax:  [phone]

## 2017-04-08 DIAGNOSIS — K921 Melena: Secondary | ICD-10-CM | POA: Diagnosis not present

## 2017-04-08 DIAGNOSIS — K219 Gastro-esophageal reflux disease without esophagitis: Secondary | ICD-10-CM | POA: Diagnosis not present

## 2017-04-29 ENCOUNTER — Other Ambulatory Visit: Payer: Self-pay | Admitting: Internal Medicine

## 2017-05-21 DIAGNOSIS — K921 Melena: Secondary | ICD-10-CM | POA: Diagnosis not present

## 2017-05-27 DIAGNOSIS — K921 Melena: Secondary | ICD-10-CM | POA: Diagnosis not present

## 2017-05-28 DIAGNOSIS — Z13820 Encounter for screening for osteoporosis: Secondary | ICD-10-CM | POA: Diagnosis not present

## 2017-05-28 DIAGNOSIS — Z6825 Body mass index (BMI) 25.0-25.9, adult: Secondary | ICD-10-CM | POA: Diagnosis not present

## 2017-05-28 DIAGNOSIS — Z01419 Encounter for gynecological examination (general) (routine) without abnormal findings: Secondary | ICD-10-CM | POA: Diagnosis not present

## 2017-05-28 DIAGNOSIS — Z1231 Encounter for screening mammogram for malignant neoplasm of breast: Secondary | ICD-10-CM | POA: Diagnosis not present

## 2017-05-28 LAB — HM PAP SMEAR: HM PAP: NEGATIVE

## 2017-05-30 LAB — HM MAMMOGRAPHY

## 2017-06-06 DIAGNOSIS — K449 Diaphragmatic hernia without obstruction or gangrene: Secondary | ICD-10-CM | POA: Diagnosis not present

## 2017-06-06 DIAGNOSIS — R195 Other fecal abnormalities: Secondary | ICD-10-CM | POA: Diagnosis not present

## 2017-06-17 ENCOUNTER — Other Ambulatory Visit: Payer: Self-pay | Admitting: Internal Medicine

## 2017-06-25 ENCOUNTER — Other Ambulatory Visit: Payer: Self-pay | Admitting: Internal Medicine

## 2017-06-28 ENCOUNTER — Encounter: Payer: Self-pay | Admitting: *Deleted

## 2017-07-12 DIAGNOSIS — D509 Iron deficiency anemia, unspecified: Secondary | ICD-10-CM | POA: Diagnosis not present

## 2017-07-31 ENCOUNTER — Other Ambulatory Visit: Payer: BLUE CROSS/BLUE SHIELD

## 2017-08-05 ENCOUNTER — Ambulatory Visit: Payer: BLUE CROSS/BLUE SHIELD | Admitting: Internal Medicine

## 2017-08-31 ENCOUNTER — Other Ambulatory Visit: Payer: Self-pay | Admitting: Internal Medicine

## 2017-09-04 ENCOUNTER — Other Ambulatory Visit: Payer: BLUE CROSS/BLUE SHIELD

## 2017-09-04 DIAGNOSIS — I10 Essential (primary) hypertension: Secondary | ICD-10-CM | POA: Diagnosis not present

## 2017-09-04 DIAGNOSIS — E1169 Type 2 diabetes mellitus with other specified complication: Secondary | ICD-10-CM | POA: Diagnosis not present

## 2017-09-04 DIAGNOSIS — E785 Hyperlipidemia, unspecified: Principal | ICD-10-CM

## 2017-09-04 DIAGNOSIS — E119 Type 2 diabetes mellitus without complications: Secondary | ICD-10-CM

## 2017-09-05 LAB — LIPID PANEL
Cholesterol: 213 mg/dL — ABNORMAL HIGH (ref <200)
HDL: 46 mg/dL — ABNORMAL LOW (ref >50)
LDL Cholesterol (Calc): 133 mg/dL (calc) — ABNORMAL HIGH
Non-HDL Cholesterol (Calc): 167 mg/dL (calc) — ABNORMAL HIGH (ref <130)
Total CHOL/HDL Ratio: 4.6 (calc) (ref <5.0)
Triglycerides: 207 mg/dL — ABNORMAL HIGH (ref <150)

## 2017-09-05 LAB — BASIC METABOLIC PANEL
BUN: 16 mg/dL (ref 7–25)
CO2: 28 mmol/L (ref 20–32)
Calcium: 9.8 mg/dL (ref 8.6–10.4)
Chloride: 103 mmol/L (ref 98–110)
Creat: 0.72 mg/dL (ref 0.50–1.05)
Glucose, Bld: 110 mg/dL — ABNORMAL HIGH (ref 65–99)
Potassium: 4 mmol/L (ref 3.5–5.3)
Sodium: 139 mmol/L (ref 135–146)

## 2017-09-05 LAB — HEMOGLOBIN A1C
Hgb A1c MFr Bld: 5.9 % of total Hgb — ABNORMAL HIGH (ref <5.7)
Mean Plasma Glucose: 123 (calc)
eAG (mmol/L): 6.8 (calc)

## 2017-09-09 ENCOUNTER — Encounter: Payer: Self-pay | Admitting: Internal Medicine

## 2017-09-09 ENCOUNTER — Ambulatory Visit: Payer: BLUE CROSS/BLUE SHIELD | Admitting: Internal Medicine

## 2017-09-09 VITALS — BP 122/80 | HR 63 | Temp 98.1°F | Ht 67.0 in | Wt 167.0 lb

## 2017-09-09 DIAGNOSIS — E663 Overweight: Secondary | ICD-10-CM | POA: Diagnosis not present

## 2017-09-09 DIAGNOSIS — I10 Essential (primary) hypertension: Secondary | ICD-10-CM | POA: Diagnosis not present

## 2017-09-09 DIAGNOSIS — E1169 Type 2 diabetes mellitus with other specified complication: Secondary | ICD-10-CM | POA: Diagnosis not present

## 2017-09-09 DIAGNOSIS — E785 Hyperlipidemia, unspecified: Secondary | ICD-10-CM | POA: Diagnosis not present

## 2017-09-09 DIAGNOSIS — D508 Other iron deficiency anemias: Secondary | ICD-10-CM

## 2017-09-09 DIAGNOSIS — K5903 Drug induced constipation: Secondary | ICD-10-CM

## 2017-09-09 DIAGNOSIS — E119 Type 2 diabetes mellitus without complications: Secondary | ICD-10-CM | POA: Diagnosis not present

## 2017-09-09 MED ORDER — POLYETHYLENE GLYCOL 3350 17 GM/SCOOP PO POWD: 17.0000 g | Freq: Two times a day (BID) | ORAL | 1 refills | Status: DC | PRN

## 2017-09-09 MED ORDER — POLYETHYLENE GLYCOL 3350 17 GM/SCOOP PO POWD: 17.0000 g | Freq: Every day | ORAL | 1 refills | Status: AC

## 2017-09-09 NOTE — Patient Instructions (Signed)
Fat and Cholesterol Restricted Diet Getting too much fat and cholesterol in your diet may cause health problems. Following this diet helps keep your fat and cholesterol at normal levels. This can keep you from getting sick. What types of fat should I choose?  Choose monosaturated and polyunsaturated fats. These are found in foods such as olive oil, canola oil, flaxseeds, walnuts, almonds, and seeds.  Eat more omega-3 fats. Good choices include salmon, mackerel, sardines, tuna, flaxseed oil, and ground flaxseeds.  Limit saturated fats. These are in animal products such as meats, butter, and cream. They can also be in plant products such as palm oil, palm kernel oil, and coconut oil.  Avoid foods with partially hydrogenated oils in them. These contain trans fats. Examples of foods that have trans fats are stick margarine, some tub margarines, cookies, crackers, and other baked goods. What general guidelines do I need to follow?  Check food labels. Look for the words "trans fat" and "saturated fat."  When preparing a meal: ? Fill half of your plate with vegetables and green salads. ? Fill one fourth of your plate with whole grains. Look for the word "whole" as the first word in the ingredient list. ? Fill one fourth of your plate with lean protein foods.  Eat more foods that have fiber, like apples, carrots, beans, peas, and barley.  Eat more home-cooked foods. Eat less at restaurants and buffets.  Limit or avoid alcohol.  Limit foods high in starch and sugar.  Limit fried foods.  Cook foods without frying them. Baking, boiling, grilling, and broiling are all great options.  Lose weight if you are overweight. Losing even a small amount of weight can help your overall health. It can also help prevent diseases such as diabetes and heart disease. What foods can I eat? Grains Whole grains, such as whole wheat or whole grain breads, crackers, cereals, and pasta. Unsweetened oatmeal,  bulgur, barley, quinoa, or brown rice. Corn or whole wheat flour tortillas. Vegetables Fresh or frozen vegetables (raw, steamed, roasted, or grilled). Green salads. Fruits All fresh, canned (in natural juice), or frozen fruits. Meat and Other Protein Products Ground beef (85% or leaner), grass-fed beef, or beef trimmed of fat. Skinless chicken or turkey. Ground chicken or turkey. Pork trimmed of fat. All fish and seafood. Eggs. Dried beans, peas, or lentils. Unsalted nuts or seeds. Unsalted canned or dry beans. Dairy Low-fat dairy products, such as skim or 1% milk, 2% or reduced-fat cheeses, low-fat ricotta or cottage cheese, or plain low-fat yogurt. Fats and Oils Tub margarines without trans fats. Light or reduced-fat mayonnaise and salad dressings. Avocado. Olive, canola, sesame, or safflower oils. Natural peanut or almond butter (choose ones without added sugar and oil). The items listed above may not be a complete list of recommended foods or beverages. Contact your dietitian for more options. What foods are not recommended? Grains White bread. White pasta. White rice. Cornbread. Bagels, pastries, and croissants. Crackers that contain trans fat. Vegetables White potatoes. Corn. Creamed or fried vegetables. Vegetables in a cheese sauce. Fruits Dried fruits. Canned fruit in light or heavy syrup. Fruit juice. Meat and Other Protein Products Fatty cuts of meat. Ribs, chicken wings, bacon, sausage, bologna, salami, chitterlings, fatback, hot dogs, bratwurst, and packaged luncheon meats. Liver and organ meats. Dairy Whole or 2% milk, cream, half-and-half, and cream cheese. Whole milk cheeses. Whole-fat or sweetened yogurt. Full-fat cheeses. Nondairy creamers and whipped toppings. Processed cheese, cheese spreads, or cheese curds. Sweets and Desserts Corn   syrup, sugars, honey, and molasses. Candy. Jam and jelly. Syrup. Sweetened cereals. Cookies, pies, cakes, donuts, muffins, and ice  cream. Fats and Oils Butter, stick margarine, lard, shortening, ghee, or bacon fat. Coconut, palm kernel, or palm oils. Beverages Alcohol. Sweetened drinks (such as sodas, lemonade, and fruit drinks or punches). The items listed above may not be a complete list of foods and beverages to avoid. Contact your dietitian for more information. This information is not intended to replace advice given to you by your health care provider. Make sure you discuss any questions you have with your health care provider. Document Released: 08/07/2011 Document Revised: 10/13/2015 Document Reviewed: 05/07/2013 Elsevier Interactive Patient Education  2018 Elsevier Inc.  

## 2017-09-09 NOTE — Progress Notes (Signed)
Location:  [facility] [place] [facility] Provider:  Azlan Hanway L. Mariea Clonts, D.O., C.M.D.  Code Status: full code Goals of Care:  Advanced Directives 09/09/2017  Does Patient Have a Medical Advance Directive? No  Would patient like information on creating a medical advance directive? No - Patient declined  Pre-existing out of facility DNR order (yellow form or pink MOST form) -   Chief Complaint  Patient presents with  . Medical Management of Chronic Issues    58mth follow-up    HPI: Patient is a 52 y.o. female seen today for medical management of chronic diseases.    Iron deficiency anemia:  Taking mvi with iron in it.  Had been related to vaginal bleeding.  Also was seen by GI for egd and cscope and ok  HTN:  BP at goal on hctz.  No dizziness. Hydrating with water.  Hyperlipidemia:  Repatha was not covered at all even with her commercial insurance. On zetia.  Takes baby asa.  Zetia has improved her LDL from 162 to 133.  Thinks spinach-artichoke dip may have increased her TG.  Cuts fat off of pork chops.  Avoiding red meat.  Eating mostly fruits.  Veggies also.  Has had margarine from her mother-in-law's cooking.  Constipation:  Takes miralax in her coffee or water.  She is stopped up from the iron otherwise.  Hyperglycemia:  Using cinnamon.  hba1c is stable at 5.9.    Had mammo and pap in March of this year with Dr. Matthew Saras at [facility]--need report.  Past Medical History:  Diagnosis Date  . Abdominal pain, right lower quadrant   . Acute frontal sinusitis   . Acute upper respiratory infections of unspecified site   . Allergic rhinitis, cause unspecified   . Anemia, unspecified   . Anxiety state, unspecified   . Candidiasis of vulva and vagina   . Contact dermatitis and other eczema, due to unspecified cause   . Dizziness and giddiness   . Dysmenorrhea   . Dysuria   . Dysuria   . Edema   . Esophageal reflux   . Essential hypertension, benign   . External hemorrhoids without mention  of complication   . Headache(784.0)   . Hyperpotassemia   . Hypopotassemia   . Iron deficiency anemia, unspecified   . Loss of weight   . Lumbago   . Neoplasm of uncertain behavior of skin   . Nontoxic uninodular goiter   . Other abnormal blood chemistry   . Other abnormal blood chemistry   . Other and unspecified hyperlipidemia   . Palpitations   . Pure hypercholesterolemia   . Reflux esophagitis   . Routine general medical examination at a health care facility   . Routine gynecological examination   . Shortness of breath   . Sprain of tibiofibular (ligament), distal of ankle   . Unspecified constipation   . Unspecified essential hypertension   . Unspecified pruritic disorder   . Urinary tract infection, site not specified     No past surgical history on file.  Allergies  Allergen Reactions  . Fenofibrate Other (See Comments)    Only took for one month after she developed difficulty urinating  . Crestor [Rosuvastatin] Other (See Comments)    Muscle cramps  . Lipitor [Atorvastatin] Other (See Comments)    Muscle cramps  . Metformin And Related Palpitations  . Pravachol [Pravastatin Sodium] Other (See Comments)    Muscle cramps in legs  . Other     Cranberries   .  Penicillins     Pt was told by father  NOT to take Penicillins.  . Sulfa Antibiotics   . Vioxx [Rofecoxib]     Outpatient Encounter Medications as of 09/09/2017  Medication Sig  . aspirin 81 MG EC tablet Take 1 tablet (81 mg total) by mouth daily. Swallow whole.  Marland Kitchen CINNAMON PO Take 2 tablets by mouth daily.  Marland Kitchen docusate sodium (COLACE) 50 MG capsule Take by mouth at bedtime. For constipation  . ezetimibe (ZETIA) 10 MG tablet Take 1 tablet (10 mg total) by mouth daily.  . fish oil-omega-3 fatty acids 1000 MG capsule Take by mouth daily.  . hydrochlorothiazide (HYDRODIURIL) 25 MG tablet TAKE 1 TABLET (25 MG TOTAL) BY MOUTH DAILY.  Marland Kitchen TRIGELS-F FORTE 460-60-0.01-1 MG CAPS capsule TAKE ONE CAPSULE EVERY DAY    . [DISCONTINUED] Evolocumab with Infusor ([place]) 420 MG/3.5ML SOCT Inject 3.5 mLs into the skin every 30 (thirty) days.   No facility-administered encounter medications on file as of 09/09/2017.     Review of Systems:  Review of Systems  Constitutional: Negative for chills, fever and malaise/fatigue.  HENT: Negative for congestion and hearing loss.   Eyes: Negative for blurred vision.  Respiratory: Negative for cough and shortness of breath.   Cardiovascular: Negative for chest pain, palpitations and leg swelling.  Gastrointestinal: Positive for constipation. Negative for abdominal pain, blood in stool and melena.  Genitourinary: Negative for dysuria.  Musculoskeletal: Negative for falls and joint pain.  Skin: Negative for itching and rash.  Neurological: Negative for dizziness and loss of consciousness.  Endo/Heme/Allergies: Does not bruise/bleed easily.  Psychiatric/Behavioral: Negative for depression and memory loss. The patient is not nervous/anxious and does not have insomnia.     Health Maintenance  Topic Date Due  . HIV Screening  10/17/1978  . OPHTHALMOLOGY EXAM  07/22/2016  . MAMMOGRAM  12/16/2016  . FOOT EXAM  04/16/2017  . PNEUMOCOCCAL POLYSACCHARIDE VACCINE (1) 04/15/2021 (Originally 10/16/1967)  . PAP SMEAR  12/13/2017  . HEMOGLOBIN A1C  03/07/2018  . URINE MICROALBUMIN  03/28/2018  . TETANUS/TDAP  05/13/2021  . COLONOSCOPY  08/15/2026  . Hepatitis C Screening  Completed    Physical Exam: Vitals:   09/09/17 0828  BP: 122/80  Pulse: 63  Temp: 98.1 F (36.7 C)  TempSrc: Oral  SpO2: 98%  Weight: 167 lb (75.8 kg)  Height: 5\' 7"  (1.702 m)   Body mass index is 26.16 kg/m. Physical Exam  Constitutional: She is oriented to person, place, and time. She appears well-developed and well-nourished. No distress.  HENT:  Head: Normocephalic and atraumatic.  Cardiovascular: Normal rate, regular rhythm, normal heart sounds and intact distal  pulses.  Pulmonary/Chest: Effort normal and breath sounds normal. No respiratory distress.  Abdominal: Bowel sounds are normal.  Musculoskeletal: Normal range of motion. She exhibits no tenderness.  Neurological: She is alert and oriented to person, place, and time.  Skin: Skin is warm and dry. Capillary refill takes less than 2 seconds.  Psychiatric: She has a normal mood and affect. Her behavior is normal. Judgment and thought content normal.   Labs reviewed: Basic Metabolic Panel: Recent Labs    10/11/16 0812 03/28/17 0807 09/04/17 0819  NA 140 141 139  K 4.1 3.8 4.0  CL 103 103 103  CO2 28 29 28   GLUCOSE 99 100* 110*  BUN 16 16 16   CREATININE 0.73 0.66 0.72  CALCIUM 9.9 9.8 9.8   Liver Function Tests: Recent Labs    03/28/17 0932  AST 13  ALT 17  BILITOT 0.5  PROT 7.5   No results for input(s): LIPASE, AMYLASE in the last 8760 hours. No results for input(s): AMMONIA in the last 8760 hours. CBC: Recent Labs    10/11/16 0812 03/28/17 0807  WBC 5.8 5.0  NEUTROABS 2,958 2,525  HGB 12.9 13.1  HCT 39.1 40.5  MCV 78.0* 79.7*  PLT 315 303   Lipid Panel: Recent Labs    10/11/16 0812 03/28/17 0807 09/04/17 0819  CHOL 225* 244* 213*  HDL 47* 50* 46*  LDLCALC 144* 162* 133*  TRIG 171* 172* 207*  CHOLHDL 4.8 4.9 4.6   Lab Results  Component Value Date   HGBA1C 5.9 (H) 09/04/2017   Assessment/Plan 1. Other iron deficiency anemia - continue MVI with iron - CBC with Differential/Platelet; Future  2. Hyperlipidemia associated with type 2 diabetes mellitus ([place]) - cont zetia therapy and improved diet - Lipid panel; Future  3. Controlled type 2 diabetes mellitus without complication, without long-term current use of insulin (HCC) - sugar average has been stable at 5.9 - Hemoglobin A1c; Future  4. Essential hypertension, benign -bp at goal with hctz, no problems with dizziness  5  Drug-induced constipation -due to iron therapy - GI recommended daily  miralax which has been effective for her - polyethylene glycol powder (GLYCOLAX/MIRALAX) powder; Take 17 g by mouth daily.  Dispense: 850 g; Refill: 1  6. Overweight with body mass index (BMI) 25.0-29.9 -cont to work on healthy diet and walk at breaks during work as now job involves standing still vs walking a lot like she did in the [place] GFR; Future  Labs/tests ordered:   Orders Placed This Encounter  Procedures  . CBC with Differential/Platelet    Standing Status:   Future    Standing Expiration Date:   05/11/2018  . COMPLETE METABOLIC PANEL WITH GFR    Standing Status:   Future    Standing Expiration Date:   05/11/2018  . Hemoglobin A1c    Standing Status:   Future    Standing Expiration Date:   05/11/2018  . Lipid panel    Standing Status:   Future    Standing Expiration Date:   05/11/2018   Next appt:  02/17/2018 med mgt, fasting labs before  Saudia Smyser L. Ezriel Boffa, D.O. [facility] [address],  [address] Cell Phone (Mon-Fri 8am-5pm):  [phone] On Call:  [phone] & follow prompts after 5pm & weekends Office Phone:  [phone] Office Fax:  [phone]

## 2017-09-12 ENCOUNTER — Encounter: Payer: Self-pay | Admitting: *Deleted

## 2017-09-30 DIAGNOSIS — D509 Iron deficiency anemia, unspecified: Secondary | ICD-10-CM | POA: Diagnosis not present

## 2017-12-23 ENCOUNTER — Other Ambulatory Visit: Payer: Self-pay | Admitting: Internal Medicine

## 2018-02-10 ENCOUNTER — Other Ambulatory Visit: Payer: BLUE CROSS/BLUE SHIELD

## 2018-02-10 DIAGNOSIS — D508 Other iron deficiency anemias: Secondary | ICD-10-CM | POA: Diagnosis not present

## 2018-02-10 DIAGNOSIS — E785 Hyperlipidemia, unspecified: Secondary | ICD-10-CM | POA: Diagnosis not present

## 2018-02-10 DIAGNOSIS — E1169 Type 2 diabetes mellitus with other specified complication: Secondary | ICD-10-CM

## 2018-02-10 DIAGNOSIS — E663 Overweight: Secondary | ICD-10-CM

## 2018-02-10 DIAGNOSIS — E119 Type 2 diabetes mellitus without complications: Secondary | ICD-10-CM

## 2018-02-11 LAB — COMPLETE METABOLIC PANEL WITH GFR
AG Ratio: 2 (calc) (ref 1.0–2.5)
ALT: 41 U/L — ABNORMAL HIGH (ref 6–29)
AST: 19 U/L (ref 10–35)
Albumin: 4.8 g/dL (ref 3.6–5.1)
Alkaline phosphatase (APISO): 44 U/L (ref 33–130)
BUN: 14 mg/dL (ref 7–25)
CO2: 31 mmol/L (ref 20–32)
Calcium: 9.7 mg/dL (ref 8.6–10.4)
Chloride: 103 mmol/L (ref 98–110)
Creat: 0.58 mg/dL (ref 0.50–1.05)
GFR, Est African American: 121 mL/min/{1.73_m2} (ref >=60)
GFR, Est Non African American: 104 mL/min/{1.73_m2} (ref >=60)
Globulin: 2.4 g/dL (calc) (ref 1.9–3.7)
Glucose, Bld: 118 mg/dL — ABNORMAL HIGH (ref 65–99)
Potassium: 3.9 mmol/L (ref 3.5–5.3)
Sodium: 141 mmol/L (ref 135–146)
Total Bilirubin: 0.4 mg/dL (ref 0.2–1.2)
Total Protein: 7.2 g/dL (ref 6.1–8.1)

## 2018-02-11 LAB — LIPID PANEL
Cholesterol: 205 mg/dL — ABNORMAL HIGH (ref <200)
HDL: 55 mg/dL (ref >50)
LDL Cholesterol (Calc): 124 mg/dL (calc) — ABNORMAL HIGH
Non-HDL Cholesterol (Calc): 150 mg/dL (calc) — ABNORMAL HIGH (ref <130)
Total CHOL/HDL Ratio: 3.7 (calc) (ref <5.0)
Triglycerides: 149 mg/dL (ref <150)

## 2018-02-11 LAB — CBC WITH DIFFERENTIAL/PLATELET
Absolute Monocytes: 447 cells/uL (ref 200–950)
Basophils Absolute: 52 cells/uL (ref 0–200)
Basophils Relative: 0.9 %
Eosinophils Absolute: 128 cells/uL (ref 15–500)
Eosinophils Relative: 2.2 %
HCT: 38.3 % (ref 35.0–45.0)
Hemoglobin: 13.3 g/dL (ref 11.7–15.5)
Lymphs Abs: 2105 cells/uL (ref 850–3900)
MCH: 28 pg (ref 27.0–33.0)
MCHC: 34.7 g/dL (ref 32.0–36.0)
MCV: 80.6 fL (ref 80.0–100.0)
MPV: 10.7 fL (ref 7.5–12.5)
Monocytes Relative: 7.7 %
Neutro Abs: 3068 cells/uL (ref 1500–7800)
Neutrophils Relative %: 52.9 %
Platelets: 296 10*3/uL (ref 140–400)
RBC: 4.75 10*6/uL (ref 3.80–5.10)
RDW: 13.7 % (ref 11.0–15.0)
Total Lymphocyte: 36.3 %
WBC: 5.8 10*3/uL (ref 3.8–10.8)

## 2018-02-11 LAB — HEMOGLOBIN A1C
Hgb A1c MFr Bld: 6.2 % of total Hgb — ABNORMAL HIGH (ref <5.7)
Mean Plasma Glucose: 131 (calc)
eAG (mmol/L): 7.3 (calc)

## 2018-02-17 ENCOUNTER — Ambulatory Visit: Payer: BLUE CROSS/BLUE SHIELD | Admitting: Internal Medicine

## 2018-02-17 ENCOUNTER — Encounter: Payer: Self-pay | Admitting: Internal Medicine

## 2018-02-17 VITALS — BP 112/70 | HR 74 | Temp 98.1°F | Ht 67.0 in | Wt 172.0 lb

## 2018-02-17 DIAGNOSIS — I10 Essential (primary) hypertension: Secondary | ICD-10-CM

## 2018-02-17 DIAGNOSIS — E663 Overweight: Secondary | ICD-10-CM | POA: Diagnosis not present

## 2018-02-17 DIAGNOSIS — D508 Other iron deficiency anemias: Secondary | ICD-10-CM

## 2018-02-17 DIAGNOSIS — Z6826 Body mass index (BMI) 26.0-26.9, adult: Secondary | ICD-10-CM | POA: Diagnosis not present

## 2018-02-17 DIAGNOSIS — E1169 Type 2 diabetes mellitus with other specified complication: Secondary | ICD-10-CM

## 2018-02-17 DIAGNOSIS — E119 Type 2 diabetes mellitus without complications: Secondary | ICD-10-CM

## 2018-02-17 DIAGNOSIS — E785 Hyperlipidemia, unspecified: Secondary | ICD-10-CM

## 2018-02-17 NOTE — Patient Instructions (Addendum)
Please prepare yourself to get your pneumonia vaccine at the next appointment.    Fat and Cholesterol Restricted Eating Plan Getting too much fat and cholesterol in your diet may cause health problems. Choosing the right foods helps keep your fat and cholesterol at normal levels. This can keep you from getting certain diseases. Your doctor may recommend an eating plan that includes:  Total fat: ______% or less of total calories a day.  Saturated fat: ______% or less of total calories a day.  Cholesterol: less than _________mg a day.  Fiber: ______g a day. What are tips for following this plan? Meal planning  At meals, divide your plate into four equal parts: ? Fill one-half of your plate with vegetables and green salads. ? Fill one-fourth of your plate with whole grains. ? Fill one-fourth of your plate with low-fat (lean) protein foods.  Eat fish that is high in omega-3 fats at least two times a week. This includes mackerel, tuna, sardines, and salmon.  Eat foods that are high in fiber, such as whole grains, beans, apples, broccoli, carrots, peas, and barley. General tips   Work with your doctor to lose weight if you need to.  Avoid: ? Foods with added sugar. ? Fried foods. ? Foods with partially hydrogenated oils.  Limit alcohol intake to no more than 1 drink a day for nonpregnant women and 2 drinks a day for men. One drink equals 12 oz of beer, 5 oz of wine, or 1 oz of hard liquor. Reading food labels  Check food labels for: ? Trans fats. ? Partially hydrogenated oils. ? Saturated fat (g) in each serving. ? Cholesterol (mg) in each serving. ? Fiber (g) in each serving.  Choose foods with healthy fats, such as: ? Monounsaturated fats. ? Polyunsaturated fats. ? Omega-3 fats.  Choose grain products that have whole grains. Look for the word "whole" as the first word in the ingredient list. Cooking  Cook foods using low-fat methods. These include baking, boiling,  grilling, and broiling.  Eat more home-cooked foods. Eat at restaurants and buffets less often.  Avoid cooking using saturated fats, such as butter, cream, palm oil, palm kernel oil, and coconut oil. Recommended foods  Fruits  All fresh, canned (in natural juice), or frozen fruits. Vegetables  Fresh or frozen vegetables (raw, steamed, roasted, or grilled). Green salads. Grains  Whole grains, such as whole wheat or whole grain breads, crackers, cereals, and pasta. Unsweetened oatmeal, bulgur, barley, quinoa, or brown rice. Corn or whole wheat flour tortillas. Meats and other protein foods  Ground beef (85% or leaner), grass-fed beef, or beef trimmed of fat. Skinless chicken or [place]. Ground chicken or [place]. Pork trimmed of fat. All fish and seafood. Egg whites. Dried beans, peas, or lentils. Unsalted nuts or seeds. Unsalted canned beans. Nut butters without added sugar or oil. Dairy  Low-fat or nonfat dairy products, such as skim or 1% milk, 2% or reduced-fat cheeses, low-fat and fat-free ricotta or cottage cheese, or plain low-fat and nonfat yogurt. Fats and oils  Tub margarine without trans fats. Light or reduced-fat mayonnaise and salad dressings. Avocado. Olive, canola, sesame, or safflower oils. The items listed above may not be a complete list of foods and beverages you can eat. Contact a dietitian for more information. Foods to avoid Fruits  Canned fruit in heavy syrup. Fruit in cream or butter sauce. Fried fruit. Vegetables  Vegetables cooked in cheese, cream, or butter sauce. Fried vegetables. Grains  White bread. White pasta.  White rice. Cornbread. Bagels, pastries, and croissants. Crackers and snack foods that contain trans fat and hydrogenated oils. Meats and other protein foods  Fatty cuts of meat. Ribs, chicken wings, bacon, sausage, bologna, salami, chitterlings, fatback, hot dogs, bratwurst, and packaged lunch meats. Liver and organ meats. Whole eggs and egg  yolks. Chicken and [place] with skin. Fried meat. Dairy  Whole or 2% milk, cream, half-and-half, and cream cheese. Whole milk cheeses. Whole-fat or sweetened yogurt. Full-fat cheeses. Nondairy creamers and whipped toppings. Processed cheese, cheese spreads, and cheese curds. Beverages  Alcohol. Sugar-sweetened drinks such as sodas, lemonade, and fruit drinks. Fats and oils  Butter, stick margarine, lard, shortening, ghee, or bacon fat. Coconut, palm kernel, and palm oils. Sweets and desserts  Corn syrup, sugars, honey, and molasses. Candy. Jam and jelly. Syrup. Sweetened cereals. Cookies, pies, cakes, donuts, muffins, and ice cream. The items listed above may not be a complete list of foods and beverages you should avoid. Contact a dietitian for more information. Summary  Choosing the right foods helps keep your fat and cholesterol at normal levels. This can keep you from getting certain diseases.  At meals, fill one-half of your plate with vegetables and green salads.  Eat high-fiber foods, like whole grains, beans, apples, carrots, peas, and barley.  Limit added sugar, saturated fats, alcohol, and fried foods. This information is not intended to replace advice given to you by your health care provider. Make sure you discuss any questions you have with your health care provider. Document Released: 08/07/2011 Document Revised: 10/09/2017 Document Reviewed: 10/23/2016 Elsevier Interactive Patient Education  2019 [employer].

## 2018-02-17 NOTE — Progress Notes (Signed)
Location:  [facility] clinic Provider:  Kairo Laubacher L. Mariea Clonts, D.O., C.M.D.  Code Status: full code Goals of Care:  Advanced Directives 09/09/2017  Does Patient Have a Medical Advance Directive? No  Would patient like information on creating a medical advance directive? No - Patient declined  Pre-existing out of facility DNR order (yellow form or pink MOST form) -   Chief Complaint  Patient presents with  . Medical Management of Chronic Issues    med mgt   HPI: Patient is a 52 y.o. female seen today for medical management of chronic diseases.    She's up 5 lbs with the holidays.  Sugar is also up but cholesterol is down.    Not anemic now.   Potassium normal despite cramps she's getting sometimes.  Discussed fluctuations in such levels.    Discussed need to reduce sweets intake.  Also needs pneumonia vaccine.  She is resistant too getting this.  Wants to wait on it.    She is due to go for her eye exam coming up.  [facility].   Taking miralax and bowels move with it.  Puts it in her coffee so it dissolves better.  Past Medical History:  Diagnosis Date  . Abdominal pain, right lower quadrant   . Acute frontal sinusitis   . Acute upper respiratory infections of unspecified site   . Allergic rhinitis, cause unspecified   . Anemia, unspecified   . Anxiety state, unspecified   . Candidiasis of vulva and vagina   . Contact dermatitis and other eczema, due to unspecified cause   . Dizziness and giddiness   . Dysmenorrhea   . Dysuria   . Dysuria   . Edema   . Esophageal reflux   . Essential hypertension, benign   . External hemorrhoids without mention of complication   . Headache(784.0)   . Hyperpotassemia   . Hypopotassemia   . Iron deficiency anemia, unspecified   . Loss of weight   . Lumbago   . Neoplasm of uncertain behavior of skin   . Nontoxic uninodular goiter   . Other abnormal blood chemistry   . Other abnormal blood chemistry   .  Other and unspecified hyperlipidemia   . Palpitations   . Pure hypercholesterolemia   . Reflux esophagitis   . Routine general medical examination at a health care facility   . Routine gynecological examination   . Shortness of breath   . Sprain of tibiofibular (ligament), distal of ankle   . Unspecified constipation   . Unspecified essential hypertension   . Unspecified pruritic disorder   . Urinary tract infection, site not specified     No past surgical history on file.  Allergies  Allergen Reactions  . Fenofibrate Other (See Comments)    Only took for one month after she developed difficulty urinating  . Crestor [Rosuvastatin] Other (See Comments)    Muscle cramps  . Lipitor [Atorvastatin] Other (See Comments)    Muscle cramps  . Metformin And Related Palpitations  . Pravachol [Pravastatin Sodium] Other (See Comments)    Muscle cramps in legs  . Other     Cranberries   . Penicillins     Pt was told by father  NOT to take Penicillins.  . Sulfa Antibiotics   . Vioxx [Rofecoxib]     Outpatient Encounter Medications as of 02/17/2018  Medication Sig  . aspirin 81 MG EC tablet Take 1 tablet (81 mg total) by mouth daily.  Swallow whole.  Marland Kitchen CINNAMON PO Take 2 tablets by mouth daily.  Marland Kitchen ezetimibe (ZETIA) 10 MG tablet Take 1 tablet (10 mg total) by mouth daily.  . fish oil-omega-3 fatty acids 1000 MG capsule Take by mouth daily.  . hydrochlorothiazide (HYDRODIURIL) 25 MG tablet TAKE 1 TABLET BY MOUTH DAILY  . polyethylene glycol powder (GLYCOLAX/MIRALAX) powder Take 17 g by mouth daily.  . TRIGELS-F FORTE 460-60-0.01-1 MG CAPS capsule TAKE ONE CAPSULE EVERY DAY   No facility-administered encounter medications on file as of 02/17/2018.     Review of Systems:  Review of Systems  Constitutional: Negative for chills, fever and malaise/fatigue.       Wt gain  HENT: Negative for congestion and hearing loss.   Eyes: Negative for blurred vision.  Respiratory: Negative for  cough and shortness of breath.   Cardiovascular: Negative for chest pain, palpitations and leg swelling.  Gastrointestinal: Positive for constipation. Negative for abdominal pain, blood in stool, diarrhea, melena, nausea and vomiting.  Genitourinary: Negative for dysuria.  Musculoskeletal: Negative for back pain, falls and joint pain.  Skin: Negative for itching and rash.  Neurological: Negative for dizziness and loss of consciousness.  Endo/Heme/Allergies: Does not bruise/bleed easily.  Psychiatric/Behavioral: Negative for depression and memory loss. The patient is not nervous/anxious and does not have insomnia.     Health Maintenance  Topic Date Due  . PNEUMOCOCCAL POLYSACCHARIDE VACCINE AGE 50-64 HIGH RISK  10/16/1965  . HIV Screening  10/17/1978  . OPHTHALMOLOGY EXAM  07/22/2016  . URINE MICROALBUMIN  03/28/2018  . HEMOGLOBIN A1C  08/12/2018  . FOOT EXAM  09/10/2018  . MAMMOGRAM  05/31/2019  . PAP SMEAR-Modifier  05/28/2020  . TETANUS/TDAP  05/13/2021  . COLONOSCOPY  08/15/2026  . Hepatitis C Screening  Completed    Physical Exam: Vitals:   02/17/18 1457  BP: 112/70  Pulse: 74  Temp: 98.1 F (36.7 C)  TempSrc: Oral  SpO2: 96%  Weight: 172 lb (78 kg)  Height: 5\' 7"  (1.702 m)   Body mass index is 26.94 kg/m. Physical Exam  Labs reviewed: Basic Metabolic Panel: Recent Labs    03/28/17 0807 09/04/17 0819 02/10/18 0837  NA 141 139 141  K 3.8 4.0 3.9  CL 103 103 103  CO2 29 28 31   GLUCOSE 100* 110* 118*  BUN 16 16 14   CREATININE 0.66 0.72 0.58  CALCIUM 9.8 9.8 9.7   Liver Function Tests: Recent Labs    03/28/17 0807 02/10/18 0837  AST 13 19  ALT 17 41*  BILITOT 0.5 0.4  PROT 7.5 7.2   No results for input(s): LIPASE, AMYLASE in the last 8760 hours. No results for input(s): AMMONIA in the last 8760 hours. CBC: Recent Labs    03/28/17 0807 02/10/18 0837  WBC 5.0 5.8  NEUTROABS 2,525 3,068  HGB 13.1 13.3  HCT 40.5 38.3  MCV 79.7* 80.6  PLT 303  296   Lipid Panel: Recent Labs    03/28/17 0807 09/04/17 0819 02/10/18 0837  CHOL 244* 213* 205*  HDL 50* 46* 55  LDLCALC 162* 133* 124*  TRIG 172* 207* 149  CHOLHDL 4.9 4.6 3.7   Lab Results  Component Value Date   HGBA1C 6.2 (H) 02/10/2018    Assessment/Plan 1. Overweight with body mass index (BMI) 25.0-29.9 -counseled on need for improved diet and exercise regimen--she seems to understand and says her weight will come back down to normal range  2. Body mass index 26.0-26.9, adult -as above, consistent with  just mildly overweight  3. Other iron deficiency anemia - not anemic this check, cont to follow--was related to heavy menses historically - CBC with Differential/Platelet; Future  4. Controlled type 2 diabetes mellitus without complication, without long-term current use of insulin ([place]) - trended up, monitor and improve diet so this comes back down - Hemoglobin A1c; Future - COMPLETE METABOLIC PANEL WITH GFR; Future - CBC with Differential/Platelet; Future  5. Hyperlipidemia associated with type 2 diabetes mellitus ([place]) -was oddly a little better, but sugar worse, counseled on foods that affect each value - Lipid panel; Future  6. Essential hypertension, benign -bp at goal with current regimen, cont same--on hctz, is not on ace/arb--will need to consider change of med at next visit due to diabetes  - COMPLETE METABOLIC PANEL WITH GFR; Future  Labs/tests ordered:   Orders Placed This Encounter  Procedures  . Hemoglobin A1c    Standing Status:   Future    Standing Expiration Date:   02/18/2019  . Lipid panel    Standing Status:   Future    Standing Expiration Date:   02/18/2019    Order Specific Question:   Has the patient fasted?    Answer:   Yes  . COMPLETE METABOLIC PANEL WITH GFR    Standing Status:   Future    Standing Expiration Date:   02/18/2019  . CBC with Differential/Platelet    Standing Status:   Future    Standing Expiration Date:    02/18/2019    Next appt:  6 mos for CPE, fasting labs before  Sergio Zawislak L. Kaliq Lege, D.O. [facility] [address], [place] [address] Cell Phone (Mon-Fri 8am-5pm):  [phone] On Call:  [phone] & follow prompts after 5pm & weekends Office Phone:  [phone] Office Fax:  [phone]

## 2018-04-07 ENCOUNTER — Other Ambulatory Visit: Payer: Self-pay | Admitting: Internal Medicine

## 2018-04-07 DIAGNOSIS — E1169 Type 2 diabetes mellitus with other specified complication: Secondary | ICD-10-CM

## 2018-04-07 DIAGNOSIS — E785 Hyperlipidemia, unspecified: Principal | ICD-10-CM

## 2018-04-11 ENCOUNTER — Telehealth: Payer: Self-pay | Admitting: *Deleted

## 2018-04-11 NOTE — Telephone Encounter (Signed)
Received faxed Prior Authorization form from [facility] for patient's Ezetimibe 10mg . Filled out form and faxed to [facility]. Fax [phone] Awaiting Response  ID: [id number]

## 2018-04-29 ENCOUNTER — Other Ambulatory Visit: Payer: Self-pay | Admitting: Internal Medicine

## 2018-05-11 ENCOUNTER — Other Ambulatory Visit: Payer: Self-pay | Admitting: Internal Medicine

## 2018-05-12 NOTE — Telephone Encounter (Signed)
Refill request completed.  Has an appointment scheduled for July.  Last note states may need to consider change in medication since she is diabetic.

## 2018-06-15 ENCOUNTER — Encounter: Payer: Self-pay | Admitting: Internal Medicine

## 2018-08-06 ENCOUNTER — Ambulatory Visit (INDEPENDENT_AMBULATORY_CARE_PROVIDER_SITE_OTHER): Payer: BC Managed Care – PPO | Admitting: Nurse Practitioner

## 2018-08-06 ENCOUNTER — Other Ambulatory Visit: Payer: Self-pay

## 2018-08-06 ENCOUNTER — Encounter: Payer: Self-pay | Admitting: Nurse Practitioner

## 2018-08-06 VITALS — BP 118/80 | HR 67 | Temp 98.0°F | Resp 18 | Wt 171.2 lb

## 2018-08-06 DIAGNOSIS — L03116 Cellulitis of left lower limb: Secondary | ICD-10-CM

## 2018-08-06 DIAGNOSIS — W57XXXA Bitten or stung by nonvenomous insect and other nonvenomous arthropods, initial encounter: Secondary | ICD-10-CM

## 2018-08-06 MED ORDER — DOXYCYCLINE HYCLATE 100 MG PO TABS: 100.0000 mg | ORAL_TABLET | Freq: Two times a day (BID) | ORAL | 0 refills | Status: DC

## 2018-08-06 NOTE — Patient Instructions (Signed)
To take doxycycline 100 mg by mouth twice daily for 1` week  Take with food to avoid GI upset Can use probiotic twice daily to help with GI symptoms  Redness and warmth should get better on antibiotic, if it worsens or fails to improve to notify office   Tick Bite Information, Adult  Ticks are insects that can bite. Most ticks live in shrubs and grassy areas. They climb onto people and animals that go by. Then they bite. Some ticks carry germs that can make you sick. How can I prevent tick bites?  Use an insect repellent that has 20% or higher of the ingredients DEET, picaridin, or IR3535. Put this insect repellent on: ? Bare skin. ? The tops of your boots. ? Your pant legs. ? The ends of your sleeves.  If you use an insect repellent that has the ingredient permethrin, make sure to follow the instructions on the bottle. Treat the following: ? Clothing. ? Supplies. ? Boots. ? Tents.  Wear long sleeves, long pants, and light colors.  Tuck your pant legs into your socks.  Stay in the middle of the trail.  Try not to walk through long grass.  Before going inside your house, check your clothes, hair, and skin for ticks. Make sure to check your head, neck, armpits, waist, groin, and joint areas.  Check for ticks every day.  When you come indoors: ? Wash your clothes right away. ? Shower right away. ? Dry your clothes in a dryer on high heat for 60 minutes or more. What is the right way to remove a tick? Remove a tick from your skin as soon as possible.  To remove a tick that is crawling on your skin: ? Go outdoors and brush the tick off. ? Use tape or a lint roller.  To remove a tick that is biting: ? Wash your hands. ? If you have latex gloves, put them on. ? Use tweezers, curved forceps, or a tick-removal tool to grasp the tick. Grasp the tick as close to your skin and as close to the tick's head as possible. ? Gently pull up until the tick lets go.  Try to keep the  tick's head attached to its body.  Do not twist or jerk the tick.  Do not squeeze or crush the tick. Do not try to remove a tick with heat, alcohol, petroleum jelly, or fingernail polish. How should I get rid of a tick? Here are some ways to get rid of a tick that is alive:  Place the tick in rubbing alcohol.  Place the tick in a bag or container you can close tightly.  Wrap the tick tightly in tape.  Flush the tick down the toilet. Contact a doctor if:  You have symptoms of a disease, such as: ? Pain in a muscle, joint, or bone. ? Trouble walking or moving your legs. ? Numbness in your legs. ? Inability to move (paralysis). ? A red rash that makes a circle (bull's-eye rash). ? Redness and swelling where the tick bit you. ? A fever. ? Throwing up (vomiting) over and over. ? Diarrhea. ? Weight loss. ? Tender and swollen lymph glands. ? Shortness of breath. ? Cough. ? Belly pain (abdominal pain). ? Headache. ? Being more tired than normal. ? A change in how alert (conscious) you are. ? Confusion. Get help right away if:  You cannot remove a tick.  A part of a tick breaks off and gets stuck in  your skin.  You are feeling worse. Summary  Ticks may carry germs that can make you sick.  To prevent tick bites, wear long sleeves, long pants, and light colors. Use insect repellent. Follow the instructions on the bottle.  If the tick is biting, do not try to remove it with heat, alcohol, petroleum jelly, or fingernail polish.  Use tweezers, curved forceps, or a tick-removal tool to grasp the tick. Gently pull up until the tick lets go. Do not twist or jerk the tick. Do not squeeze or crush the tick.  If you have symptoms, contact a doctor. This information is not intended to replace advice given to you by your health care provider. Make sure you discuss any questions you have with your health care provider. Document Released: 05/02/2009 Document Revised: 05/18/2016  Document Reviewed: 05/18/2016 Elsevier Interactive Patient Education  2019 [employer].

## 2018-08-06 NOTE — Progress Notes (Signed)
Careteam: Patient Care Team: Gayland Curry, DO as PCP - General (Geriatric Medicine) Thalia Bloodgood, OD as Referring Physician (Optometry)  Advanced Directive information    Allergies  Allergen Reactions  . Fenofibrate Other (See Comments)    Only took for one month after she developed difficulty urinating  . Crestor [Rosuvastatin] Other (See Comments)    Muscle cramps  . Lipitor [Atorvastatin] Other (See Comments)    Muscle cramps  . Metformin And Related Palpitations  . Pravachol [Pravastatin Sodium] Other (See Comments)    Muscle cramps in legs  . Other     Cranberries   . Penicillins     Pt was told by father  NOT to take Penicillins.  . Sulfa Antibiotics   . Vioxx [Rofecoxib]     Chief Complaint  Patient presents with  . Acute Visit    Tick Bite, Large re mark on left lower leg     HPI: Patient is a 53 y.o. female seen in the office today for evaluation of tick bite. Removed a small tick last week. Redness noted to area. Continued to put alcohol on area but has become larger. No fever.   No rash. No muscle or joint aches or pains. No chest pain or discomfort.   Review of Systems:  Review of Systems  Constitutional: Negative for chills, fever, malaise/fatigue and weight loss.  Respiratory: Negative for cough and shortness of breath.   Cardiovascular: Negative for palpitations and leg swelling.  Musculoskeletal: Negative for joint pain, myalgias and neck pain.  Skin: Negative for itching and rash.       Red area after tick bite    Past Medical History:  Diagnosis Date  . Abdominal pain, right lower quadrant   . Acute frontal sinusitis   . Acute upper respiratory infections of unspecified site   . Allergic rhinitis, cause unspecified   . Anemia, unspecified   . Anxiety state, unspecified   . Candidiasis of vulva and vagina   . Contact dermatitis and other eczema, due to unspecified cause   . Dizziness and giddiness   . Dysmenorrhea   . Dysuria    . Dysuria   . Edema   . Esophageal reflux   . Essential hypertension, benign   . External hemorrhoids without mention of complication   . Headache(784.0)   . Hyperpotassemia   . Hypopotassemia   . Iron deficiency anemia, unspecified   . Loss of weight   . Lumbago   . Neoplasm of uncertain behavior of skin   . Nontoxic uninodular goiter   . Other abnormal blood chemistry   . Other abnormal blood chemistry   . Other and unspecified hyperlipidemia   . Palpitations   . Pure hypercholesterolemia   . Reflux esophagitis   . Routine general medical examination at a health care facility   . Routine gynecological examination   . Shortness of breath   . Sprain of tibiofibular (ligament), distal of ankle   . Unspecified constipation   . Unspecified essential hypertension   . Unspecified pruritic disorder   . Urinary tract infection, site not specified    History reviewed. No pertinent surgical history. Social History:   reports that she has never smoked. She has never used smokeless tobacco. She reports that she does not drink alcohol or use drugs.  History reviewed. No pertinent family history.  Medications: Patient's Medications  New Prescriptions   No medications on file  Previous Medications   ASPIRIN 81 MG EC  TABLET    Take 1 tablet (81 mg total) by mouth daily. Swallow whole.   CINNAMON PO    Take 2 tablets by mouth daily.   EZETIMIBE (ZETIA) 10 MG TABLET    TAKE 1 TABLET BY MOUTH EVERY DAY   FISH OIL-OMEGA-3 FATTY ACIDS 1000 MG CAPSULE    Take by mouth daily.   HYDROCHLOROTHIAZIDE (HYDRODIURIL) 25 MG TABLET    TAKE 1 TABLET BY MOUTH DAILY   POLYETHYLENE GLYCOL POWDER (GLYCOLAX/MIRALAX) POWDER    Take 17 g by mouth daily.   TRIGELS-F FORTE 460-60-0.01-1 MG CAPS CAPSULE    TAKE ONE CAPSULE EVERY DAY  Modified Medications   No medications on file  Discontinued Medications   No medications on file    Physical Exam:  Vitals:   08/06/18 0806  BP: 118/80  Pulse: 67   Resp: 18  Temp: 98 F (36.7 C)  TempSrc: Oral  SpO2: 97%  Weight: 171 lb 3.2 oz (77.7 kg)   Body mass index is 26.81 kg/m. Wt Readings from Last 3 Encounters:  08/06/18 171 lb 3.2 oz (77.7 kg)  02/17/18 172 lb (78 kg)  09/09/17 167 lb (75.8 kg)    Physical Exam Constitutional:      General: She is not in acute distress.    Appearance: She is well-developed.  HENT:     Head: Normocephalic and atraumatic.  Cardiovascular:     Rate and Rhythm: Normal rate and regular rhythm.     Heart sounds: Normal heart sounds.  Pulmonary:     Effort: Pulmonary effort is normal. No respiratory distress.     Breath sounds: Normal breath sounds.  Abdominal:     General: Bowel sounds are normal.  Musculoskeletal: Normal range of motion.        General: No tenderness.  Skin:    General: Skin is warm and dry.     Capillary Refill: Capillary refill takes less than 2 seconds.     Findings: Erythema present.     Comments:  1.5 cm x 1 cm red raised area. non tender to left shin, no drainage noted  Neurological:     Mental Status: She is alert and oriented to person, place, and time.  Psychiatric:        Behavior: Behavior normal.        Thought Content: Thought content normal.        Judgment: Judgment normal.     Labs reviewed: Basic Metabolic Panel: Recent Labs    09/04/17 0819 02/10/18 0837  NA 139 141  K 4.0 3.9  CL 103 103  CO2 28 31  GLUCOSE 110* 118*  BUN 16 14  CREATININE 0.72 0.58  CALCIUM 9.8 9.7   Liver Function Tests: Recent Labs    02/10/18 0837  AST 19  ALT 41*  BILITOT 0.4  PROT 7.2   No results for input(s): LIPASE, AMYLASE in the last 8760 hours. No results for input(s): AMMONIA in the last 8760 hours. CBC: Recent Labs    02/10/18 0837  WBC 5.8  NEUTROABS 3,068  HGB 13.3  HCT 38.3  MCV 80.6  PLT 296   Lipid Panel: Recent Labs    09/04/17 0819 02/10/18 0837  CHOL 213* 205*  HDL 46* 55  LDLCALC 133* 124*  TRIG 207* 149  CHOLHDL 4.6 3.7    TSH: No results for input(s): TSH in the last 8760 hours. A1C: Lab Results  Component Value Date   HGBA1C 6.2 (H) 02/10/2018  Assessment/Plan 1. Tick bite, initial encounter -information provided to avoid tick bites, now with raised, warm red area - doxycycline (VIBRA-TABS) 100 MG tablet; Take 1 tablet (100 mg total) by mouth 2 (two) times daily.  Dispense: 14 tablet; Refill: 0  2. Cellulitis of left lower extremity -due to tick bite - doxycycline (VIBRA-TABS) 100 MG tablet; Take 1 tablet (100 mg total) by mouth 2 (two) times daily.  Dispense: 14 tablet; Refill: 0 -follow up precautions discussed   K. [place], [facility] [phone]

## 2018-08-11 ENCOUNTER — Encounter: Payer: Self-pay | Admitting: Internal Medicine

## 2018-08-11 ENCOUNTER — Other Ambulatory Visit: Payer: Self-pay

## 2018-08-11 ENCOUNTER — Ambulatory Visit (INDEPENDENT_AMBULATORY_CARE_PROVIDER_SITE_OTHER): Payer: BC Managed Care – PPO | Admitting: Internal Medicine

## 2018-08-11 VITALS — BP 120/80 | HR 71 | Temp 97.9°F | Ht 67.0 in | Wt 172.0 lb

## 2018-08-11 DIAGNOSIS — W57XXXS Bitten or stung by nonvenomous insect and other nonvenomous arthropods, sequela: Secondary | ICD-10-CM

## 2018-08-11 DIAGNOSIS — L03116 Cellulitis of left lower limb: Secondary | ICD-10-CM | POA: Diagnosis not present

## 2018-08-11 NOTE — Patient Instructions (Signed)
You may use warm compresses and antibiotic ointment on your tick bite area.  It looks like it's healing to me.

## 2018-08-11 NOTE — Progress Notes (Signed)
Location:  [facility] clinic Provider: Findlay Dagher L. Mariea Clonts, D.O., C.M.D.   Goals of Care:  Advanced Directives 09/09/2017  Does Patient Have a Medical Advance Directive? No  Would patient like information on creating a medical advance directive? No - Patient declined  Pre-existing out of facility DNR order (yellow form or pink MOST form) -     Chief Complaint  Patient presents with  . Acute Visit    tick bite not getting better    HPI: Patient is a 53 y.o. female seen today for an acute visit for "tick bite not getting better".  She was seen on 6/17 for removing a small tick--it left a red area 1.5cm x 1cm red raised area, nontender to left shin without drainage per NP note. She was given doxycycline 100mg  po bid x 14 days for cellulitis of the left leg.  She's had no fever, chills, has no pain there.  Past Medical History:  Diagnosis Date  . Abdominal pain, right lower quadrant   . Acute frontal sinusitis   . Acute upper respiratory infections of unspecified site   . Allergic rhinitis, cause unspecified   . Anemia, unspecified   . Anxiety state, unspecified   . Candidiasis of vulva and vagina   . Contact dermatitis and other eczema, due to unspecified cause   . Dizziness and giddiness   . Dysmenorrhea   . Dysuria   . Dysuria   . Edema   . Esophageal reflux   . Essential hypertension, benign   . External hemorrhoids without mention of complication   . Headache(784.0)   . Hyperpotassemia   . Hypopotassemia   . Iron deficiency anemia, unspecified   . Loss of weight   . Lumbago   . Neoplasm of uncertain behavior of skin   . Nontoxic uninodular goiter   . Other abnormal blood chemistry   . Other abnormal blood chemistry   . Other and unspecified hyperlipidemia   . Palpitations   . Pure hypercholesterolemia   . Reflux esophagitis   . Routine general medical examination at a health care facility   . Routine gynecological examination   . Shortness of breath   . Sprain of  tibiofibular (ligament), distal of ankle   . Unspecified constipation   . Unspecified essential hypertension   . Unspecified pruritic disorder   . Urinary tract infection, site not specified     No past surgical history on file.  Allergies  Allergen Reactions  . Fenofibrate Other (See Comments)    Only took for one month after she developed difficulty urinating  . Crestor [Rosuvastatin] Other (See Comments)    Muscle cramps  . Lipitor [Atorvastatin] Other (See Comments)    Muscle cramps  . Metformin And Related Palpitations  . Pravachol [Pravastatin Sodium] Other (See Comments)    Muscle cramps in legs  . Other     Cranberries   . Penicillins     Pt was told by father  NOT to take Penicillins.  . Sulfa Antibiotics   . Vioxx [Rofecoxib]     Outpatient Encounter Medications as of 08/11/2018  Medication Sig  . aspirin 81 MG EC tablet Take 1 tablet (81 mg total) by mouth daily. Swallow whole.  Marland Kitchen CINNAMON PO Take 2 tablets by mouth daily.  Marland Kitchen doxycycline (VIBRA-TABS) 100 MG tablet Take 1 tablet (100 mg total) by mouth 2 (two) times daily.  Marland Kitchen ezetimibe (ZETIA) 10 MG tablet TAKE 1 TABLET BY MOUTH EVERY DAY  . fish oil-omega-3  fatty acids 1000 MG capsule Take by mouth daily.  . hydrochlorothiazide (HYDRODIURIL) 25 MG tablet TAKE 1 TABLET BY MOUTH DAILY  . polyethylene glycol powder (GLYCOLAX/MIRALAX) powder Take 17 g by mouth daily.  . TRIGELS-F FORTE 460-60-0.01-1 MG CAPS capsule TAKE ONE CAPSULE EVERY DAY   No facility-administered encounter medications on file as of 08/11/2018.     Review of Systems:  Review of Systems  Constitutional: Negative for chills, fever and malaise/fatigue.  Musculoskeletal: Negative for joint pain and myalgias.  Skin: Negative for itching and rash.       Purple area around tick bite    Health Maintenance  Topic Date Due  . PNEUMOCOCCAL POLYSACCHARIDE VACCINE AGE 70-64 HIGH RISK  10/16/1965  . HIV Screening  10/17/1978  . OPHTHALMOLOGY EXAM   07/22/2016  . URINE MICROALBUMIN  03/28/2018  . HEMOGLOBIN A1C  08/12/2018  . FOOT EXAM  09/10/2018  . MAMMOGRAM  05/31/2019  . PAP SMEAR-Modifier  05/28/2020  . TETANUS/TDAP  05/13/2021  . COLONOSCOPY  08/15/2026  . Hepatitis C Screening  Completed    Physical Exam: Vitals:   08/11/18 1459  BP: 120/80  Pulse: 71  Temp: 97.9 F (36.6 C)  TempSrc: Oral  SpO2: 97%  Weight: 172 lb (78 kg)  Height: 5\' 7"  (1.702 m)   Body mass index is 26.94 kg/m. Physical Exam Vitals signs reviewed.  Constitutional:      Appearance: Normal appearance.  Musculoskeletal: Normal range of motion.  Skin:    Comments: Pale; about 1 cm ecchymotic area around site of tick bite; central area slightly raised where scab present at bite site; no surrounding erythema, warmth, no drainage  Neurological:     General: No focal deficit present.     Mental Status: She is alert and oriented to person, place, and time.     Labs reviewed: Basic Metabolic Panel: Recent Labs    09/04/17 0819 02/10/18 0837  NA 139 141  K 4.0 3.9  CL 103 103  CO2 28 31  GLUCOSE 110* 118*  BUN 16 14  CREATININE 0.72 0.58  CALCIUM 9.8 9.7   Liver Function Tests: Recent Labs    02/10/18 0837  AST 19  ALT 41*  BILITOT 0.4  PROT 7.2   No results for input(s): LIPASE, AMYLASE in the last 8760 hours. No results for input(s): AMMONIA in the last 8760 hours. CBC: Recent Labs    02/10/18 0837  WBC 5.8  NEUTROABS 3,068  HGB 13.3  HCT 38.3  MCV 80.6  PLT 296   Lipid Panel: Recent Labs    09/04/17 0819 02/10/18 0837  CHOL 213* 205*  HDL 46* 55  LDLCALC 133* 124*  TRIG 207* 149  CHOLHDL 4.6 3.7   Lab Results  Component Value Date   HGBA1C 6.2 (H) 02/10/2018    Procedures since last visit: No results found.  Assessment/Plan 1. Cellulitis of left lower extremity -resolved; pt completed her doxycycline therapy  2. Tick bite, sequela -has residual ecchymoses at site of bite, but does not currently  appear infected -may use warm compress and triple antibiotic ointment   Labs/tests ordered:  No new Next appt:  08/20/2018  Joyce Richard L. Copper Kirtley, D.O. [facility] [address], [place] [address] Cell Phone (Mon-Fri 8am-5pm):  [phone] On Call:  [phone] & follow prompts after 5pm & weekends Office Phone:  [phone] Office Fax:  [phone]

## 2018-08-20 ENCOUNTER — Other Ambulatory Visit: Payer: BLUE CROSS/BLUE SHIELD

## 2018-08-25 ENCOUNTER — Encounter: Payer: BLUE CROSS/BLUE SHIELD | Admitting: Internal Medicine

## 2018-09-17 ENCOUNTER — Other Ambulatory Visit: Payer: Self-pay

## 2018-09-17 ENCOUNTER — Other Ambulatory Visit: Payer: BC Managed Care – PPO

## 2018-09-17 DIAGNOSIS — E1169 Type 2 diabetes mellitus with other specified complication: Secondary | ICD-10-CM

## 2018-09-17 DIAGNOSIS — E119 Type 2 diabetes mellitus without complications: Secondary | ICD-10-CM

## 2018-09-17 DIAGNOSIS — D508 Other iron deficiency anemias: Secondary | ICD-10-CM

## 2018-09-17 DIAGNOSIS — I10 Essential (primary) hypertension: Secondary | ICD-10-CM

## 2018-09-17 DIAGNOSIS — E785 Hyperlipidemia, unspecified: Secondary | ICD-10-CM

## 2018-09-18 ENCOUNTER — Encounter: Payer: Self-pay | Admitting: *Deleted

## 2018-09-18 LAB — LIPID PANEL
Cholesterol: 224 mg/dL — ABNORMAL HIGH (ref <200)
HDL: 49 mg/dL — ABNORMAL LOW (ref >=50)
LDL Cholesterol (Calc): 133 mg/dL (calc) — ABNORMAL HIGH
Non-HDL Cholesterol (Calc): 175 mg/dL (calc) — ABNORMAL HIGH (ref <130)
Total CHOL/HDL Ratio: 4.6 (calc) (ref <5.0)
Triglycerides: 297 mg/dL — ABNORMAL HIGH (ref <150)

## 2018-09-18 LAB — COMPLETE METABOLIC PANEL WITH GFR
AG Ratio: 2.1 (calc) (ref 1.0–2.5)
ALT: 32 U/L — ABNORMAL HIGH (ref 6–29)
AST: 18 U/L (ref 10–35)
Albumin: 4.9 g/dL (ref 3.6–5.1)
Alkaline phosphatase (APISO): 40 U/L (ref 37–153)
BUN: 17 mg/dL (ref 7–25)
CO2: 29 mmol/L (ref 20–32)
Calcium: 10.1 mg/dL (ref 8.6–10.4)
Chloride: 103 mmol/L (ref 98–110)
Creat: 0.66 mg/dL (ref 0.50–1.05)
GFR, Est African American: 116 mL/min/{1.73_m2} (ref >=60)
GFR, Est Non African American: 100 mL/min/{1.73_m2} (ref >=60)
Globulin: 2.3 g/dL (calc) (ref 1.9–3.7)
Glucose, Bld: 126 mg/dL — ABNORMAL HIGH (ref 65–99)
Potassium: 4.4 mmol/L (ref 3.5–5.3)
Sodium: 142 mmol/L (ref 135–146)
Total Bilirubin: 0.4 mg/dL (ref 0.2–1.2)
Total Protein: 7.2 g/dL (ref 6.1–8.1)

## 2018-09-18 LAB — CBC WITH DIFFERENTIAL/PLATELET
Absolute Monocytes: 441 cells/uL (ref 200–950)
Basophils Absolute: 52 cells/uL (ref 0–200)
Basophils Relative: 0.9 %
Eosinophils Absolute: 110 cells/uL (ref 15–500)
Eosinophils Relative: 1.9 %
HCT: 38.8 % (ref 35.0–45.0)
Hemoglobin: 13.1 g/dL (ref 11.7–15.5)
Lymphs Abs: 2111 cells/uL (ref 850–3900)
MCH: 28 pg (ref 27.0–33.0)
MCHC: 33.8 g/dL (ref 32.0–36.0)
MCV: 82.9 fL (ref 80.0–100.0)
MPV: 10.8 fL (ref 7.5–12.5)
Monocytes Relative: 7.6 %
Neutro Abs: 3086 cells/uL (ref 1500–7800)
Neutrophils Relative %: 53.2 %
Platelets: 281 10*3/uL (ref 140–400)
RBC: 4.68 10*6/uL (ref 3.80–5.10)
RDW: 13.6 % (ref 11.0–15.0)
Total Lymphocyte: 36.4 %
WBC: 5.8 10*3/uL (ref 3.8–10.8)

## 2018-09-18 LAB — HEMOGLOBIN A1C
Hgb A1c MFr Bld: 6.5 % of total Hgb — ABNORMAL HIGH (ref <5.7)
Mean Plasma Glucose: 140 (calc)
eAG (mmol/L): 7.7 (calc)

## 2018-09-22 ENCOUNTER — Encounter: Payer: BC Managed Care – PPO | Admitting: Internal Medicine

## 2018-10-01 ENCOUNTER — Telehealth: Payer: Self-pay

## 2018-10-01 DIAGNOSIS — E1169 Type 2 diabetes mellitus with other specified complication: Secondary | ICD-10-CM

## 2018-10-01 DIAGNOSIS — E785 Hyperlipidemia, unspecified: Secondary | ICD-10-CM

## 2018-10-01 MED ORDER — EZETIMIBE 10 MG PO TABS: 10.0000 mg | ORAL_TABLET | Freq: Every day | ORAL | 1 refills | Status: DC

## 2018-10-01 NOTE — Telephone Encounter (Signed)
Patient called today to request refill for Ezetimide 10 mg tab. Stated her cholesterol was up.

## 2018-11-06 ENCOUNTER — Other Ambulatory Visit: Payer: Self-pay | Admitting: Internal Medicine

## 2018-12-06 ENCOUNTER — Other Ambulatory Visit: Payer: Self-pay | Admitting: Internal Medicine

## 2018-12-08 ENCOUNTER — Ambulatory Visit (INDEPENDENT_AMBULATORY_CARE_PROVIDER_SITE_OTHER): Payer: BC Managed Care – PPO | Admitting: Internal Medicine

## 2018-12-08 ENCOUNTER — Encounter: Payer: Self-pay | Admitting: Internal Medicine

## 2018-12-08 ENCOUNTER — Other Ambulatory Visit: Payer: Self-pay

## 2018-12-08 VITALS — BP 130/80 | HR 74 | Temp 97.6°F | Ht 67.0 in | Wt 170.0 lb

## 2018-12-08 DIAGNOSIS — E119 Type 2 diabetes mellitus without complications: Secondary | ICD-10-CM | POA: Diagnosis not present

## 2018-12-08 DIAGNOSIS — I10 Essential (primary) hypertension: Secondary | ICD-10-CM | POA: Diagnosis not present

## 2018-12-08 DIAGNOSIS — Z0000 Encounter for general adult medical examination without abnormal findings: Secondary | ICD-10-CM | POA: Diagnosis not present

## 2018-12-08 DIAGNOSIS — Z6826 Body mass index (BMI) 26.0-26.9, adult: Secondary | ICD-10-CM

## 2018-12-08 DIAGNOSIS — E785 Hyperlipidemia, unspecified: Secondary | ICD-10-CM

## 2018-12-08 DIAGNOSIS — E663 Overweight: Secondary | ICD-10-CM

## 2018-12-08 DIAGNOSIS — D508 Other iron deficiency anemias: Secondary | ICD-10-CM

## 2018-12-08 DIAGNOSIS — E1169 Type 2 diabetes mellitus with other specified complication: Secondary | ICD-10-CM | POA: Diagnosis not present

## 2018-12-08 NOTE — Progress Notes (Signed)
Location:  [facility] clinic  Provider: Dr. Hollace Kinnier   Advanced Directives 09/09/2017  Does Patient Have a Medical Advance Directive? No  Would patient like information on creating a medical advance directive? No - Patient declined  Pre-existing out of facility DNR order (yellow form or pink MOST form) -     Chief Complaint  Patient presents with  . Annual Exam    Annual Exam    HPI: Patient is a 53 y.o. female seen today for medical management of chronic diseases.    Husband has been in and out of the hospital since July, 2020 due to pneumonia and COPD. He is currently at [facility]. She is not allowed to visit her husband, but can talk to him on the phone.   She is taking her Zetia daily. No side effects reported. Following a diet low in fat and fried food.   Taking HCTZ daily. Follows a low sodium diet.   Last eye exam was May 2020.   Getting 6-7 hours of sleep a night.   Refusing flu and pneumococcal vaccine today.   No falls or recent injuries.   Still working at current job. Risk for dust is high at her job, she wears the appropriate protective equipment daily. She denies any respiratory symptoms.   Scheduled to have her mammogram November 2020.   Past Medical History:  Diagnosis Date  . Abdominal pain, right lower quadrant   . Acute frontal sinusitis   . Acute upper respiratory infections of unspecified site   . Allergic rhinitis, cause unspecified   . Anemia, unspecified   . Anxiety state, unspecified   . Candidiasis of vulva and vagina   . Contact dermatitis and other eczema, due to unspecified cause   . Dizziness and giddiness   . Dysmenorrhea   . Dysuria   . Dysuria   . Edema   . Esophageal reflux   . Essential hypertension, benign   . External hemorrhoids without mention of complication   . Headache(784.0)   . Hyperpotassemia   . Hypopotassemia   . Iron deficiency anemia, unspecified   . Loss of weight   . Lumbago   . Neoplasm of uncertain  behavior of skin   . Nontoxic uninodular goiter   . Other abnormal blood chemistry   . Other abnormal blood chemistry   . Other and unspecified hyperlipidemia   . Palpitations   . Pure hypercholesterolemia   . Reflux esophagitis   . Routine general medical examination at a health care facility   . Routine gynecological examination   . Shortness of breath   . Sprain of tibiofibular (ligament), distal of ankle   . Unspecified constipation   . Unspecified essential hypertension   . Unspecified pruritic disorder   . Urinary tract infection, site not specified     No past surgical history on file.  Allergies  Allergen Reactions  . Fenofibrate Other (See Comments)    Only took for one month after she developed difficulty urinating  . Crestor [Rosuvastatin] Other (See Comments)    Muscle cramps  . Lipitor [Atorvastatin] Other (See Comments)    Muscle cramps  . Metformin And Related Palpitations  . Pravachol [Pravastatin Sodium] Other (See Comments)    Muscle cramps in legs  . Other     Cranberries   . Penicillins     Pt was told by father  NOT to take Penicillins.  . Sulfa Antibiotics   . Vioxx [Rofecoxib]  Outpatient Encounter Medications as of 12/08/2018  Medication Sig  . aspirin 81 MG EC tablet Take 1 tablet (81 mg total) by mouth daily. Swallow whole.  Marland Kitchen CINNAMON PO Take 2 tablets by mouth daily.  . fish oil-omega-3 fatty acids 1000 MG capsule Take by mouth daily.  . hydrochlorothiazide (HYDRODIURIL) 25 MG tablet TAKE 1 TABLET BY MOUTH EVERY DAY  . polyethylene glycol powder (GLYCOLAX/MIRALAX) powder Take 17 g by mouth daily.  . TRIGELS-F FORTE 460-60-0.01-1 MG CAPS capsule TAKE ONE CAPSULE EVERY DAY  . ezetimibe (ZETIA) 10 MG tablet Take 1 tablet (10 mg total) by mouth daily. (Patient not taking: Reported on 12/08/2018)  . [DISCONTINUED] doxycycline (VIBRA-TABS) 100 MG tablet Take 1 tablet (100 mg total) by mouth 2 (two) times daily.   No facility-administered  encounter medications on file as of 12/08/2018.     Review of Systems:  Review of Systems  Constitutional: Negative for activity change, appetite change and fatigue.  HENT: Negative for dental problem, hearing loss and trouble swallowing.   Eyes: Negative for photophobia and visual disturbance.  Respiratory: Negative for cough, shortness of breath and wheezing.   Cardiovascular: Negative for chest pain and leg swelling.  Gastrointestinal: Positive for constipation. Negative for abdominal pain, diarrhea and nausea.  Endocrine: Negative for polydipsia, polyphagia and polyuria.  Genitourinary: Negative for dysuria, frequency, hematuria and vaginal bleeding.  Musculoskeletal: Negative for arthralgias and myalgias.  Skin: Negative.   Neurological: Negative for dizziness and headaches.  Psychiatric/Behavioral: Negative for decreased concentration and sleep disturbance. The patient is not nervous/anxious.     Health Maintenance  Topic Date Due  . PNEUMOCOCCAL POLYSACCHARIDE VACCINE AGE 71-64 HIGH RISK  10/16/1965  . HIV Screening  10/17/1978  . OPHTHALMOLOGY EXAM  07/22/2016  . URINE MICROALBUMIN  03/28/2018  . FOOT EXAM  09/10/2018  . HEMOGLOBIN A1C  03/20/2019  . MAMMOGRAM  05/31/2019  . PAP SMEAR-Modifier  05/28/2020  . TETANUS/TDAP  05/13/2021  . COLONOSCOPY  08/15/2026  . Hepatitis C Screening  Completed    Physical Exam: Vitals:   12/08/18 1328  BP: 130/80  Pulse: 74  Temp: 97.6 F (36.4 C)  TempSrc: Oral  SpO2: 97%  Weight: 170 lb (77.1 kg)  Height: 5\' 7"  (1.702 m)   Body mass index is 26.63 kg/m. Physical Exam Vitals signs reviewed.  Constitutional:      Appearance: Normal appearance. She is normal weight.  HENT:     Head: Normocephalic.     Right Ear: There is no impacted cerumen.     Left Ear: There is no impacted cerumen.     Nose: Nose normal.     Mouth/Throat:     Mouth: Mucous membranes are moist.     Pharynx: No posterior oropharyngeal erythema.   Eyes:     General:        Right eye: No discharge.        Left eye: No discharge.     Extraocular Movements: Extraocular movements intact.     Pupils: Pupils are equal, round, and reactive to light.  Neck:     Musculoskeletal: Full passive range of motion without pain and normal range of motion.     Thyroid: No thyroid mass, thyromegaly or thyroid tenderness.  Cardiovascular:     Rate and Rhythm: Normal rate and regular rhythm.     Pulses: Normal pulses.     Heart sounds: Normal heart sounds. No murmur.  Pulmonary:     Effort: Pulmonary effort is normal. No  respiratory distress.     Breath sounds: Normal breath sounds. No wheezing.  Abdominal:     General: Abdomen is flat. Bowel sounds are normal. There is no distension.     Palpations: Abdomen is soft.     Tenderness: There is no abdominal tenderness.  Musculoskeletal: Normal range of motion.     Right lower leg: No edema.     Left lower leg: No edema.  Lymphadenopathy:     Cervical: No cervical adenopathy.  Skin:    General: Skin is warm and dry.     Capillary Refill: Capillary refill takes less than 2 seconds.  Neurological:     General: No focal deficit present.     Mental Status: She is alert. Mental status is at baseline. She is disoriented.     Sensory: No sensory deficit.     Coordination: Coordination normal.     Deep Tendon Reflexes: Reflexes normal.  Psychiatric:        Mood and Affect: Mood normal.        Behavior: Behavior normal.        Thought Content: Thought content normal.        Judgment: Judgment normal.     Labs reviewed: Basic Metabolic Panel: Recent Labs    02/10/18 0837 09/17/18 0804  NA 141 142  K 3.9 4.4  CL 103 103  CO2 31 29  GLUCOSE 118* 126*  BUN 14 17  CREATININE 0.58 0.66  CALCIUM 9.7 10.1   Liver Function Tests: Recent Labs    02/10/18 0837 09/17/18 0804  AST 19 18  ALT 41* 32*  BILITOT 0.4 0.4  PROT 7.2 7.2   No results for input(s): LIPASE, AMYLASE in the last 8760  hours. No results for input(s): AMMONIA in the last 8760 hours. CBC: Recent Labs    02/10/18 0837 09/17/18 0804  WBC 5.8 5.8  NEUTROABS 3,068 3,086  HGB 13.3 13.1  HCT 38.3 38.8  MCV 80.6 82.9  PLT 296 281   Lipid Panel: Recent Labs    02/10/18 0837 09/17/18 0804  CHOL 205* 224*  HDL 55 49*  LDLCALC 124* 133*  TRIG 149 297*  CHOLHDL 3.7 4.6   Lab Results  Component Value Date   HGBA1C 6.5 (H) 09/17/2018    Procedures since last visit: No results found.  Assessment/Plan 1. Annual physical exam  - EKG 12 lead- today, no abnormalities  2. Essential hypertension, benign - bp at goal <140/90 - continue hydrochlorothiazide 25 mg daily - follow DASH diet - complete blood count with differential/platelets- today - complete metabolic panel with GFR- today  3. Hyperlipidemia associated with type 2 diabetes mellitus without complication, without long-term current use of insulin ([place]) - doing well with zetia daily, no side effects - not fasting for lipid panel today - lipid panel- future - hemoglobin A1C-today - microalbumin/creatitine urine ratio- today - continue a diet that limits fats and fried foods  4. Other iron deficiency anemia - no symptoms of fatigue, was thought to be related to heavy menses which has subsided - complete blood count with differential/platelet- today  5. Body mass index 26.0-26.9, adult - has been counseled numerous times in the [place] about diet and exercise - she understands that in order to lose weight she will need to make a effort  6. Overweight with body mass index (BMI) 25.0-29.9 - consistiently mildly overweight - recommendations same as above   Labs/tests ordered: complete blood count with differential/platelets, complete metabolic panel with  GFR, hemoglobin A1C, microalbumin/creatitine urine ratio, HIV antibody (routine testing w rflx)- today, lipid panel future. Flu and pneumococcal vaccine schedule for this month Next appt:   6 month follow up

## 2018-12-09 LAB — HEMOGLOBIN A1C
Hgb A1c MFr Bld: 6.4 % of total Hgb — ABNORMAL HIGH (ref <5.7)
Mean Plasma Glucose: 137 (calc)
eAG (mmol/L): 7.6 (calc)

## 2018-12-09 LAB — CBC WITH DIFFERENTIAL/PLATELET
Absolute Monocytes: 540 cells/uL (ref 200–950)
Basophils Absolute: 61 cells/uL (ref 0–200)
Basophils Relative: 0.8 %
Eosinophils Absolute: 106 cells/uL (ref 15–500)
Eosinophils Relative: 1.4 %
HCT: 39.4 % (ref 35.0–45.0)
Hemoglobin: 13.2 g/dL (ref 11.7–15.5)
Lymphs Abs: 2310 cells/uL (ref 850–3900)
MCH: 27.8 pg (ref 27.0–33.0)
MCHC: 33.5 g/dL (ref 32.0–36.0)
MCV: 83.1 fL (ref 80.0–100.0)
MPV: 10.8 fL (ref 7.5–12.5)
Monocytes Relative: 7.1 %
Neutro Abs: 4583 cells/uL (ref 1500–7800)
Neutrophils Relative %: 60.3 %
Platelets: 297 10*3/uL (ref 140–400)
RBC: 4.74 10*6/uL (ref 3.80–5.10)
RDW: 13.6 % (ref 11.0–15.0)
Total Lymphocyte: 30.4 %
WBC: 7.6 10*3/uL (ref 3.8–10.8)

## 2018-12-09 LAB — BASIC METABOLIC PANEL
BUN: 16 mg/dL (ref 7–25)
CO2: 27 mmol/L (ref 20–32)
Calcium: 9.7 mg/dL (ref 8.6–10.4)
Chloride: 102 mmol/L (ref 98–110)
Creat: 0.98 mg/dL (ref 0.50–1.05)
Glucose, Bld: 104 mg/dL (ref 65–139)
Potassium: 3.6 mmol/L (ref 3.5–5.3)
Sodium: 141 mmol/L (ref 135–146)

## 2018-12-09 LAB — MICROALBUMIN / CREATININE URINE RATIO
Creatinine, Urine: 59 mg/dL (ref 20–275)
Microalb Creat Ratio: 19 mcg/mg creat (ref <30)
Microalb, Ur: 1.1 mg/dL

## 2018-12-09 LAB — HIV ANTIBODY (ROUTINE TESTING W REFLEX): HIV 1&2 Ab, 4th Generation: NONREACTIVE

## 2019-01-02 LAB — HM MAMMOGRAPHY

## 2019-02-04 ENCOUNTER — Other Ambulatory Visit: Payer: Self-pay | Admitting: Internal Medicine

## 2019-02-11 ENCOUNTER — Other Ambulatory Visit: Payer: Self-pay | Admitting: Internal Medicine

## 2019-04-06 ENCOUNTER — Ambulatory Visit: Payer: Self-pay | Admitting: Internal Medicine

## 2019-06-08 ENCOUNTER — Other Ambulatory Visit: Payer: BC Managed Care – PPO

## 2019-06-08 ENCOUNTER — Other Ambulatory Visit: Payer: Self-pay

## 2019-06-08 DIAGNOSIS — Z0000 Encounter for general adult medical examination without abnormal findings: Secondary | ICD-10-CM

## 2019-06-08 DIAGNOSIS — E1169 Type 2 diabetes mellitus with other specified complication: Secondary | ICD-10-CM

## 2019-06-08 DIAGNOSIS — E119 Type 2 diabetes mellitus without complications: Secondary | ICD-10-CM

## 2019-06-09 LAB — COMPLETE METABOLIC PANEL WITH GFR
AG Ratio: 2.2 (calc) (ref 1.0–2.5)
ALT: 61 U/L — ABNORMAL HIGH (ref 6–29)
AST: 27 U/L (ref 10–35)
Albumin: 5.2 g/dL — ABNORMAL HIGH (ref 3.6–5.1)
Alkaline phosphatase (APISO): 44 U/L (ref 37–153)
BUN: 15 mg/dL (ref 7–25)
CO2: 30 mmol/L (ref 20–32)
Calcium: 10.2 mg/dL (ref 8.6–10.4)
Chloride: 102 mmol/L (ref 98–110)
Creat: 0.65 mg/dL (ref 0.50–1.05)
GFR, Est African American: 116 mL/min/{1.73_m2} (ref >=60)
GFR, Est Non African American: 100 mL/min/{1.73_m2} (ref >=60)
Globulin: 2.4 g/dL (calc) (ref 1.9–3.7)
Glucose, Bld: 141 mg/dL — ABNORMAL HIGH (ref 65–99)
Potassium: 4.4 mmol/L (ref 3.5–5.3)
Sodium: 143 mmol/L (ref 135–146)
Total Bilirubin: 0.5 mg/dL (ref 0.2–1.2)
Total Protein: 7.6 g/dL (ref 6.1–8.1)

## 2019-06-09 LAB — LIPID PANEL
Cholesterol: 234 mg/dL — ABNORMAL HIGH (ref <200)
HDL: 52 mg/dL (ref >=50)
LDL Cholesterol (Calc): 137 mg/dL (calc) — ABNORMAL HIGH
Non-HDL Cholesterol (Calc): 182 mg/dL (calc) — ABNORMAL HIGH (ref <130)
Total CHOL/HDL Ratio: 4.5 (calc) (ref <5.0)
Triglycerides: 313 mg/dL — ABNORMAL HIGH (ref <150)

## 2019-06-09 LAB — CBC WITH DIFFERENTIAL/PLATELET
Absolute Monocytes: 487 cells/uL (ref 200–950)
Basophils Absolute: 58 cells/uL (ref 0–200)
Basophils Relative: 1 %
Eosinophils Absolute: 110 cells/uL (ref 15–500)
Eosinophils Relative: 1.9 %
HCT: 42.6 % (ref 35.0–45.0)
Hemoglobin: 14.3 g/dL (ref 11.7–15.5)
Lymphs Abs: 2268 cells/uL (ref 850–3900)
MCH: 28.3 pg (ref 27.0–33.0)
MCHC: 33.6 g/dL (ref 32.0–36.0)
MCV: 84.2 fL (ref 80.0–100.0)
MPV: 10.8 fL (ref 7.5–12.5)
Monocytes Relative: 8.4 %
Neutro Abs: 2877 cells/uL (ref 1500–7800)
Neutrophils Relative %: 49.6 %
Platelets: 264 10*3/uL (ref 140–400)
RBC: 5.06 10*6/uL (ref 3.80–5.10)
RDW: 13.4 % (ref 11.0–15.0)
Total Lymphocyte: 39.1 %
WBC: 5.8 10*3/uL (ref 3.8–10.8)

## 2019-06-09 LAB — HEMOGLOBIN A1C
Hgb A1c MFr Bld: 6.8 % of total Hgb — ABNORMAL HIGH (ref <5.7)
Mean Plasma Glucose: 148 (calc)
eAG (mmol/L): 8.2 (calc)

## 2019-06-09 NOTE — Progress Notes (Signed)
Cholesterol remains way too high.  We need to start a medication to get this better so she does not have a heart attack or stroke.  Perhaps the new med that's a combination with zetia will work for her since she cannot take fenofibrate or statins. Sugar average has trended up Blood counts, electrolytes and kidneys ok One liver test remains slightly elevated while the others are normal

## 2019-06-11 ENCOUNTER — Ambulatory Visit (INDEPENDENT_AMBULATORY_CARE_PROVIDER_SITE_OTHER): Payer: BC Managed Care – PPO | Admitting: Family

## 2019-06-11 ENCOUNTER — Encounter: Payer: Self-pay | Admitting: Internal Medicine

## 2019-06-11 ENCOUNTER — Other Ambulatory Visit: Payer: Self-pay

## 2019-06-11 VITALS — BP 122/78 | HR 84 | Temp 97.8°F | Ht 67.0 in | Wt 170.0 lb

## 2019-06-11 DIAGNOSIS — I10 Essential (primary) hypertension: Secondary | ICD-10-CM | POA: Diagnosis not present

## 2019-06-11 DIAGNOSIS — K5903 Drug induced constipation: Secondary | ICD-10-CM

## 2019-06-11 DIAGNOSIS — E119 Type 2 diabetes mellitus without complications: Secondary | ICD-10-CM

## 2019-06-11 DIAGNOSIS — D508 Other iron deficiency anemias: Secondary | ICD-10-CM | POA: Diagnosis not present

## 2019-06-11 DIAGNOSIS — R300 Dysuria: Secondary | ICD-10-CM

## 2019-06-11 DIAGNOSIS — E1169 Type 2 diabetes mellitus with other specified complication: Secondary | ICD-10-CM | POA: Diagnosis not present

## 2019-06-11 DIAGNOSIS — R748 Abnormal levels of other serum enzymes: Secondary | ICD-10-CM

## 2019-06-11 DIAGNOSIS — Z6826 Body mass index (BMI) 26.0-26.9, adult: Secondary | ICD-10-CM

## 2019-06-11 DIAGNOSIS — E785 Hyperlipidemia, unspecified: Secondary | ICD-10-CM

## 2019-06-11 LAB — POCT URINALYSIS DIPSTICK
Bilirubin, UA: NEGATIVE
Glucose, UA: NEGATIVE
Ketones, UA: NEGATIVE
Nitrite, UA: NEGATIVE
Protein, UA: NEGATIVE
Spec Grav, UA: 1.01 (ref 1.010–1.025)
Urobilinogen, UA: 0.2 E.U./dL
pH, UA: 6.5 (ref 5.0–8.0)

## 2019-06-11 NOTE — Progress Notes (Signed)
Provider: Melani Brisbane FNP-C   Gayland Curry, DO  Patient Care Team: Gayland Curry, DO as PCP - General (Geriatric Medicine) Thalia Bloodgood, OD as Referring Physician (Optometry)  Extended Emergency Contact Information Primary Emergency Contact: Mendiola,William Address: [address]          [place] Phone: [phone] Mobile Phone: [phone] Relation: Spouse  Code Status: Full Code  Goals of care: Advanced Directive information Advanced Directives 06/11/2019  Does Patient Have a Medical Advance Directive? No  Would patient like information on creating a medical advance directive? No - Patient declined  Pre-existing out of facility DNR order (yellow form or pink MOST form) -     Chief Complaint  Patient presents with  . Medical Management of Chronic Issues    6 month follow up and lab results     HPI:  Pt is a 54 y.o. female seen today for  6 months follow up for medical management of chronic diseases.Also here to discuss lab results.I'm seeing for the first time usually follows up here at [facility] with PCP Dr.Reed.she complains of pain with urination.she denies any fever,chills,urine frequency or urgency.Also denies any lower abdominal or back pain. Her recent lab results were reviewed and discussed with patient.  Hypertension - on Hydrochlorothiazide.No home blood pressure readings for review.States B/p has been under control.She does not add extra salt to food. Denies any signs of hypotension.   Hyperlipidemia - recent labs cholesterol was 234,LDL 313,LDL 137 and normal HDL at 52.Her dietary intake reviewed states eats 4 slices of whole wheat bread daily which she applies mayonnaise." I love mayonnaise".Has tried to include vegetables in her diet.eats salads.she uses ranch in her diet.for lunch she eats either [place] or chicken sandwiches.Avoids red meat. 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 She would like to start walking states her sister walks in the evening.  She stopped taking Zetia last year 2020 states Insurance stopped paying for Zetia. Dr.Reed recommended on lab review to start on Nexletol to help lower her cholesterol but would like to change her diet and exercise.  On ASA      Type 2 Diabetes - Hgb A1C 6.8 ( 06/08/2019) previous was 6.4 currently not on any medication.Her diet includes high carbohydrates and high fats as above.No longer taking cinnamon for high blood sugar.she is willing to modify her diet and exercise.she due for her annual eye and foot exam.Has seen Ophthalmologist with [facility].  Iron deficiency anemia - recent Hgb 14.4 previous 13.2 on Trigels-F-Forte 460-60-0.01 mg Capsule daily. Previously had vaginal bleeding but denies any more bleeding.follows [facility] with [facility].   Constipation - states miralax has been effective.Also eating fruits and vegetables.   Elevated liver enzymes - AST normal but ALT slightly elevated compared to previous level.states does not drink alcohol and not taking any tylenol.   Health maintenance:  - she is due for Pneumococcal vaccine but would like to post pone until later in the fall unclear whether will get it.  - No recent mammogram on the chart but states had mammogram last year.will obtain records.  Past Medical History:  Diagnosis Date  . Abdominal pain, right lower quadrant   . Acute frontal sinusitis   . Acute upper respiratory infections of unspecified site   . Allergic rhinitis, cause unspecified   .  Anemia, unspecified   . Anxiety state, unspecified   . Candidiasis of vulva and vagina   . Contact dermatitis and other eczema, due to unspecified cause   . Dizziness and giddiness   . Dysmenorrhea   . Dysuria   . Dysuria   . Edema   . Esophageal reflux   . Essential hypertension, benign   .  External hemorrhoids without mention of complication   . Headache(784.0)   . Hyperpotassemia   . Hypopotassemia   . Iron deficiency anemia, unspecified   . Loss of weight   . Lumbago   . Neoplasm of uncertain behavior of skin   . Nontoxic uninodular goiter   . Other abnormal blood chemistry   . Other abnormal blood chemistry   . Other and unspecified hyperlipidemia   . Palpitations   . Pure hypercholesterolemia   . Reflux esophagitis   . Routine general medical examination at a health care facility   . Routine gynecological examination   . Shortness of breath   . Sprain of tibiofibular (ligament), distal of ankle   . Unspecified constipation   . Unspecified essential hypertension   . Unspecified pruritic disorder   . Urinary tract infection, site not specified    History reviewed. No pertinent surgical history.  Allergies  Allergen Reactions  . Fenofibrate Other (See Comments)    Only took for one month after she developed difficulty urinating  . Crestor [Rosuvastatin] Other (See Comments)    Muscle cramps  . Lipitor [Atorvastatin] Other (See Comments)    Muscle cramps  . Metformin And Related Palpitations  . Pravachol [Pravastatin Sodium] Other (See Comments)    Muscle cramps in legs  . Other     Cranberries   . Penicillins     Pt was told by father  NOT to take Penicillins.  . Sulfa Antibiotics   . Vioxx [Rofecoxib]     Allergies as of 06/11/2019      Reactions   Fenofibrate Other (See Comments)   Only took for one month after she developed difficulty urinating   Crestor [rosuvastatin] Other (See Comments)   Muscle cramps   Lipitor [atorvastatin] Other (See Comments)   Muscle cramps   Metformin And Related Palpitations   Pravachol [pravastatin Sodium] Other (See Comments)   Muscle cramps in legs   Other    Cranberries   Penicillins    Pt was told by father  NOT to take Penicillins.   Sulfa Antibiotics    Vioxx [rofecoxib]       Medication List         Accurate as of June 11, 2019  3:04 PM. If you have any questions, ask your nurse or doctor.        aspirin 81 MG EC tablet Take 1 tablet (81 mg total) by mouth daily. Swallow whole.   CINNAMON PO Take 2 tablets by mouth daily.   ezetimibe 10 MG tablet Commonly known as: ZETIA Take 1 tablet (10 mg total) by mouth daily.   fish oil-omega-3 fatty acids 1000 MG capsule Take by mouth daily.   hydrochlorothiazide 25 MG tablet Commonly known as: HYDRODIURIL TAKE 1 TABLET BY MOUTH EVERY DAY   polyethylene glycol powder 17 GM/SCOOP powder Commonly known as: GLYCOLAX/MIRALAX Take 17 g by mouth daily.   Trigels-F Forte 460-60-0.01-1 MG Caps capsule Generic drug: Fe Fum-Vit C-Vit B12-FA TAKE 1 CAPSULE BY MOUTH EVERY DAY       Review of Systems  Constitutional: Negative for appetite change,  chills, fatigue and fever.  HENT: Negative for congestion, rhinorrhea, sinus pressure, sinus pain, sneezing, sore throat and trouble swallowing.   Eyes: Negative for pain, discharge, redness and visual disturbance.  Respiratory: Negative for cough, chest tightness, shortness of breath and wheezing.   Cardiovascular: Negative for chest pain, palpitations and leg swelling.  Gastrointestinal: Negative for abdominal distention, abdominal pain, diarrhea, nausea and vomiting.       Miralax has been effective for constipation   Endocrine: Negative for cold intolerance, heat intolerance, polydipsia, polyphagia and polyuria.  Genitourinary: Positive for dysuria. Negative for difficulty urinating, flank pain, frequency, urgency and vaginal bleeding.  Musculoskeletal: Negative for arthralgias, gait problem and myalgias.  Skin: Negative for color change, pallor, rash and wound.  Neurological: Negative for dizziness, speech difficulty, weakness, light-headedness, numbness and headaches.  Hematological: Does not bruise/bleed easily.  Psychiatric/Behavioral: Negative for agitation, confusion and sleep  disturbance. The patient is not nervous/anxious.     Immunization History  Administered Date(s) Administered  . Tdap 05/14/2011   Pertinent  Health Maintenance Due  Topic Date Due  . OPHTHALMOLOGY EXAM  07/22/2016  . FOOT EXAM  09/10/2018  . MAMMOGRAM  05/31/2019  . HEMOGLOBIN A1C  12/08/2019  . URINE MICROALBUMIN  12/08/2019  . PAP SMEAR-Modifier  05/28/2020  . COLONOSCOPY  08/15/2026   Fall Risk  06/11/2019 12/08/2018 02/17/2018 09/09/2017 04/01/2017  Falls in the past year? 0 0 0 No No  Number falls in past yr: 0 - 0 - -  Injury with Fall? 0 - 0 - -    Vitals:   06/11/19 1436  BP: 122/78  Pulse: 84  Temp: 97.8 F (36.6 C)  SpO2: 94%  Weight: 170 lb (77.1 kg)  Height: 5\' 7"  (1.702 m)   Body mass index is 26.63 kg/m. Physical Exam Constitutional:      General: She is not in acute distress.    Appearance: She is overweight. She is not ill-appearing.  HENT:     Head: Normocephalic.     Right Ear: Tympanic membrane, ear canal and external ear normal. There is no impacted cerumen.     Left Ear: Tympanic membrane, ear canal and external ear normal. There is no impacted cerumen.     Nose: Nose normal. No congestion or rhinorrhea.     Mouth/Throat:     Mouth: Mucous membranes are moist.     Pharynx: Oropharynx is clear. No oropharyngeal exudate or posterior oropharyngeal erythema.  Eyes:     General: No scleral icterus.       Right eye: No discharge.        Left eye: No discharge.     Extraocular Movements: Extraocular movements intact.     Conjunctiva/sclera: Conjunctivae normal.     Pupils: Pupils are equal, round, and reactive to light.  Neck:     Vascular: No carotid bruit.  Cardiovascular:     Rate and Rhythm: Normal rate and regular rhythm.     Pulses: Normal pulses.     Heart sounds: Normal heart sounds. No murmur. No friction rub. No gallop.   Pulmonary:     Effort: Pulmonary effort is normal. No respiratory distress.     Breath sounds: Normal breath  sounds. No wheezing or rhonchi.  Chest:     Chest wall: No tenderness.  Abdominal:     General: Bowel sounds are normal. There is no distension.     Palpations: Abdomen is soft. There is no mass.     Tenderness: There is  no abdominal tenderness. There is no right CVA tenderness, left CVA tenderness, guarding or rebound.  Musculoskeletal:        General: No swelling or tenderness. Normal range of motion.     Cervical back: Normal range of motion. No rigidity or tenderness.     Right lower leg: No edema.     Left lower leg: No edema.  Lymphadenopathy:     Cervical: No cervical adenopathy.  Skin:    General: Skin is warm and dry.     Coloration: Skin is not pale.     Findings: No bruising, erythema or rash.  Neurological:     Mental Status: She is alert and oriented to person, place, and time.     Cranial Nerves: No cranial nerve deficit.     Sensory: No sensory deficit.     Motor: No weakness.     Coordination: Coordination normal.     Gait: Gait normal.  Psychiatric:        Mood and Affect: Mood normal.        Behavior: Behavior normal.        Thought Content: Thought content normal.        Judgment: Judgment normal.    Labs reviewed: Recent Labs    09/17/18 0804 12/08/18 1502 06/08/19 0804  NA 142 141 143  K 4.4 3.6 4.4  CL 103 102 102  CO2 29 27 30   GLUCOSE 126* 104 141*  BUN 17 16 15   CREATININE 0.66 0.98 0.65  CALCIUM 10.1 9.7 10.2   Recent Labs    09/17/18 0804 06/08/19 0804  AST 18 27  ALT 32* 61*  BILITOT 0.4 0.5  PROT 7.2 7.6   Recent Labs    09/17/18 0804 12/08/18 1502 06/08/19 0804  WBC 5.8 7.6 5.8  NEUTROABS 3,086 4,583 2,877  HGB 13.1 13.2 14.3  HCT 38.8 39.4 42.6  MCV 82.9 83.1 84.2  PLT 281 297 264   Lab Results  Component Value Date   TSH 2.850 06/09/2012   Lab Results  Component Value Date   HGBA1C 6.8 (H) 06/08/2019   Lab Results  Component Value Date   CHOL 234 (H) 06/08/2019   HDL 52 06/08/2019   LDLCALC 137 (H)  06/08/2019   TRIG 313 (H) 06/08/2019   CHOLHDL 4.5 06/08/2019    Significant Diagnostic Results in last 30 days:  No results found.  Assessment/Plan 1. Controlled type 2 diabetes mellitus without complication, without long-term current use of insulin (HCC) Lab Results  Component Value Date   HGBA1C 6.8 (H) 06/08/2019  Not on any medication.Dietary and exercise modification discussed then recheck labs in 3 months.she is motivated to make dietary changes and exercise.  - Asa 81 mg tablet daily.Not on statin due to allergies.  - Ambulatory referral to Ophthalmology request referral to [facility] for annual eye exam.  - Ambulatory referral to Podiatry for annual for exam.  - Hemoglobin A1c; Future - TSH; Future - COMPLETE METABOLIC PANEL WITH GFR; Future  2. Essential hypertension, benign B/p at goal.  - continue on Hydrochlorothiazide 25 mg tablet daily. - continue on Asprin 81 mg tablet daily for CV event prevention.  - CBC with Differential/Platelet; Future - COMPLETE METABOLIC PANEL WITH GFR; Future  3. Hyperlipidemia associated with type 2 diabetes mellitus (Garza-Salinas II) - off Zetia due to cost.Insurance stopped paying. - Allergic to Statin. - I've discuss dietary and exercise modification.she is willing to cut down on the number  of slice of bread in the morning and choose low carbohydrates,low saturated fats and high vegetables. - she will start walking exercises with her sister. - will recheck lab work in 3 months then consider Nexletol 180 mg Tablet if cholesterol,LDL and TRG still high.  - Lipid panel; Future  4. Other iron deficiency anemia Hgb normal.No bleeding.Will recheck CBC in 3 months if remains stable can discontinue Trigels-F-Forte - CBC with Differential/Platelet; Future  5. Dysuria Reports pain with urination. - encouraged to increase water intake. - AZO tablets twice daily x 2 days.  - Urine Culture - POC Urinalysis Dipstick  6.  Drug-induced constipation Miralax effective. -continue to include vegetables and fruits in diet and water intake.   7. Body mass index 26.0-26.9, adult BMI 26.63  Dietary modification and exercise as above.   8.Elevated liver Enzymes  AST normal.ALT level higher than previous level.Negative exam findings.will repeat CMP in 3 months.  - COMPLETE METABOLIC PANEL WITH GFR; Future   Family/ staff Communication: Reviewed plan of care with patient verbalized understanding.   Labs/tests ordered:  - CBC with Differential/Platelet; Future - Hemoglobin A1c; Future - TSH; Future - COMPLETE METABOLIC PANEL WITH GFR; Future - Urine Culture - POC Urinalysis Dipstick  Next Appointment : 3 months for medical management of chronic issues.labs 2-4 days prior to visit.   Joyce Hughs, NP

## 2019-06-11 NOTE — Patient Instructions (Addendum)
- cut down high carbohydrates/starchy foods as discussed and increase your low sugar fruits and vegetables to lower lower your high cholesterol and blood sugars   - Exercise at least three times per week for 30 minutes to lower your high cholesterol and blood sugars  - referral ordered for you to see ophthalmologist and podiatrist for annual diabetic eye and foot examination.specialist office will call you for appointment  - urine culture send to lab will call you in 3 days.  DASH Eating Plan DASH stands for "Dietary Approaches to Stop Hypertension." The DASH eating plan is a healthy eating plan that has been shown to reduce high blood pressure (hypertension). It may also reduce your risk for type 2 diabetes, heart disease, and stroke. The DASH eating plan may also help with weight loss. What are tips for following this plan?  General guidelines  Avoid eating more than 2,300 mg (milligrams) of salt (sodium) a day. If you have hypertension, you may need to reduce your sodium intake to 1,500 mg a day.  Limit alcohol intake to no more than 1 drink a day for nonpregnant women and 2 drinks a day for men. One drink equals 12 oz of beer, 5 oz of wine, or 1 oz of hard liquor.  Work with your health care provider to maintain a healthy body weight or to lose weight. Ask what an ideal weight is for you.  Get at least 30 minutes of exercise that causes your heart to beat faster (aerobic exercise) most days of the week. Activities may include walking, swimming, or biking.  Work with your health care provider or diet and nutrition specialist (dietitian) to adjust your eating plan to your individual calorie needs. Reading food labels   Check food labels for the amount of sodium per serving. Choose foods with less than 5 percent of the Daily Value of sodium. Generally, foods with less than 300 mg of sodium per serving fit into this eating plan.  To find whole grains, look for the word "whole" as the first  word in the ingredient list. Shopping  Buy products labeled as "low-sodium" or "no salt added."  Buy fresh foods. Avoid canned foods and premade or frozen meals. Cooking  Avoid adding salt when cooking. Use salt-free seasonings or herbs instead of table salt or sea salt. Check with your health care provider or pharmacist before using salt substitutes.  Do not fry foods. Cook foods using healthy methods such as baking, boiling, grilling, and broiling instead.  Cook with heart-healthy oils, such as olive, canola, soybean, or sunflower oil. Meal planning  Eat a balanced diet that includes: ? 5 or more servings of fruits and vegetables each day. At each meal, try to fill half of your plate with fruits and vegetables. ? Up to 6-8 servings of whole grains each day. ? Less than 6 oz of lean meat, poultry, or fish each day. A 3-oz serving of meat is about the same size as a deck of cards. One egg equals 1 oz. ? 2 servings of low-fat dairy each day. ? A serving of nuts, seeds, or beans 5 times each week. ? Heart-healthy fats. Healthy fats called Omega-3 fatty acids are found in foods such as flaxseeds and coldwater fish, like sardines, salmon, and mackerel.  Limit how much you eat of the following: ? Canned or prepackaged foods. ? Food that is high in trans fat, such as fried foods. ? Food that is high in saturated fat, such as fatty  meat. ? Sweets, desserts, sugary drinks, and other foods with added sugar. ? Full-fat dairy products.  Do not salt foods before eating.  Try to eat at least 2 vegetarian meals each week.  Eat more home-cooked food and less restaurant, buffet, and fast food.  When eating at a restaurant, ask that your food be prepared with less salt or no salt, if possible. What foods are recommended? The items listed may not be a complete list. Talk with your dietitian about what dietary choices are best for you. Grains Whole-grain or whole-wheat bread. Whole-grain or  whole-wheat pasta. Brown rice. [place]. Bulgur. Whole-grain and low-sodium cereals. Pita bread. Low-fat, low-sodium crackers. Whole-wheat flour tortillas. Vegetables Fresh or frozen vegetables (raw, steamed, roasted, or grilled). Low-sodium or reduced-sodium tomato and vegetable juice. Low-sodium or reduced-sodium tomato sauce and tomato paste. Low-sodium or reduced-sodium canned vegetables. Fruits All fresh, dried, or frozen fruit. Canned fruit in natural juice (without added sugar). Meat and other protein foods Skinless chicken or [place]. Ground chicken or [place]. Pork with fat trimmed off. Fish and seafood. Egg whites. Dried beans, peas, or lentils. Unsalted nuts, nut butters, and seeds. Unsalted canned beans. Lean cuts of beef with fat trimmed off. Low-sodium, lean deli meat. Dairy Low-fat (1%) or fat-free (skim) milk. Fat-free, low-fat, or reduced-fat cheeses. Nonfat, low-sodium ricotta or cottage cheese. Low-fat or nonfat yogurt. Low-fat, low-sodium cheese. Fats and oils Soft margarine without trans fats. Vegetable oil. Low-fat, reduced-fat, or light mayonnaise and salad dressings (reduced-sodium). Canola, safflower, olive, soybean, and sunflower oils. Avocado. Seasoning and other foods Herbs. Spices. Seasoning mixes without salt. Unsalted popcorn and pretzels. Fat-free sweets. What foods are not recommended? The items listed may not be a complete list. Talk with your dietitian about what dietary choices are best for you. Grains Baked goods made with fat, such as croissants, muffins, or some breads. Dry pasta or rice meal packs. Vegetables Creamed or fried vegetables. Vegetables in a cheese sauce. Regular canned vegetables (not low-sodium or reduced-sodium). Regular canned tomato sauce and paste (not low-sodium or reduced-sodium). Regular tomato and vegetable juice (not low-sodium or reduced-sodium). Angie Fava. Olives. Fruits Canned fruit in a light or heavy syrup. Fried fruit. Fruit  in cream or butter sauce. Meat and other protein foods Fatty cuts of meat. Ribs. Fried meat. Berniece Salines. Sausage. Bologna and other processed lunch meats. Salami. Fatback. Hotdogs. Bratwurst. Salted nuts and seeds. Canned beans with added salt. Canned or smoked fish. Whole eggs or egg yolks. Chicken or [place] with skin. Dairy Whole or 2% milk, cream, and half-and-half. Whole or full-fat cream cheese. Whole-fat or sweetened yogurt. Full-fat cheese. Nondairy creamers. Whipped toppings. Processed cheese and cheese spreads. Fats and oils Butter. Stick margarine. Lard. Shortening. Ghee. Bacon fat. Tropical oils, such as coconut, palm kernel, or palm oil. Seasoning and other foods Salted popcorn and pretzels. Onion salt, garlic salt, seasoned salt, table salt, and sea salt. Worcestershire sauce. Tartar sauce. Barbecue sauce. Teriyaki sauce. Soy sauce, including reduced-sodium. Steak sauce. Canned and packaged gravies. Fish sauce. Oyster sauce. Cocktail sauce. Horseradish that you find on the shelf. Ketchup. Mustard. Meat flavorings and tenderizers. Bouillon cubes. Hot sauce and Tabasco sauce. Premade or packaged marinades. Premade or packaged taco seasonings. Relishes. Regular salad dressings. Where to find more information:  National Heart, Lung, and [place]: https://wilson-eaton.com/  American Heart Association: www.heart.org Summary  The DASH eating plan is a healthy eating plan that has been shown to reduce high blood pressure (hypertension). It may also reduce your risk for type  2 diabetes, heart disease, and stroke.  With the DASH eating plan, you should limit salt (sodium) intake to 2,300 mg a day. If you have hypertension, you may need to reduce your sodium intake to 1,500 mg a day.  When on the DASH eating plan, aim to eat more fresh fruits and vegetables, whole grains, lean proteins, low-fat dairy, and heart-healthy fats.  Work with your health care provider or diet and nutrition specialist  (dietitian) to adjust your eating plan to your individual calorie needs. This information is not intended to replace advice given to you by your health care provider. Make sure you discuss any questions you have with your health care provider. Document Revised: 01/18/2017 Document Reviewed: 01/30/2016 Elsevier Patient Education  2020 [employer].

## 2019-06-12 LAB — URINE CULTURE
MICRO NUMBER:: 10396229
Result:: NO GROWTH
SPECIMEN QUALITY:: ADEQUATE

## 2019-07-04 ENCOUNTER — Other Ambulatory Visit: Payer: Self-pay | Admitting: Internal Medicine

## 2019-09-02 ENCOUNTER — Ambulatory Visit: Payer: BC Managed Care – PPO | Admitting: Podiatry

## 2019-09-11 ENCOUNTER — Ambulatory Visit: Payer: BC Managed Care – PPO | Admitting: Family

## 2019-09-21 ENCOUNTER — Other Ambulatory Visit: Payer: Self-pay

## 2019-09-21 ENCOUNTER — Encounter: Payer: BC Managed Care – PPO | Admitting: Podiatry

## 2019-09-21 NOTE — Progress Notes (Signed)
Patient left without being seen.  This encounter was created in error - please disregard. 

## 2019-10-02 ENCOUNTER — Ambulatory Visit: Payer: BC Managed Care – PPO | Admitting: Family

## 2019-10-10 ENCOUNTER — Other Ambulatory Visit: Payer: Self-pay | Admitting: Internal Medicine

## 2019-10-27 ENCOUNTER — Ambulatory Visit (INDEPENDENT_AMBULATORY_CARE_PROVIDER_SITE_OTHER): Payer: BC Managed Care – PPO | Admitting: Family

## 2019-10-27 ENCOUNTER — Other Ambulatory Visit: Payer: Self-pay

## 2019-10-27 ENCOUNTER — Encounter: Payer: Self-pay | Admitting: Family

## 2019-10-27 VITALS — BP 102/70 | HR 53 | Temp 97.8°F | Resp 16 | Ht 67.0 in | Wt 170.2 lb

## 2019-10-27 DIAGNOSIS — E119 Type 2 diabetes mellitus without complications: Secondary | ICD-10-CM | POA: Diagnosis not present

## 2019-10-27 DIAGNOSIS — I10 Essential (primary) hypertension: Secondary | ICD-10-CM | POA: Diagnosis not present

## 2019-10-27 DIAGNOSIS — E1169 Type 2 diabetes mellitus with other specified complication: Secondary | ICD-10-CM | POA: Diagnosis not present

## 2019-10-27 DIAGNOSIS — R0981 Nasal congestion: Secondary | ICD-10-CM

## 2019-10-27 DIAGNOSIS — D508 Other iron deficiency anemias: Secondary | ICD-10-CM | POA: Diagnosis not present

## 2019-10-27 DIAGNOSIS — E785 Hyperlipidemia, unspecified: Secondary | ICD-10-CM

## 2019-10-27 MED ORDER — FLUTICASONE PROPIONATE 50 MCG/ACT NA SUSP: 2.0000 | Freq: Every day | NASAL | 6 refills | Status: DC

## 2019-10-27 NOTE — Patient Instructions (Signed)
Postnasal Drip Postnasal drip is the feeling of mucus going down the back of your throat. Mucus is a slimy substance that moistens and cleans your nose and throat, as well as the air pockets in face bones near your forehead and cheeks (sinuses). Small amounts of mucus pass from your nose and sinuses down the back of your throat all the time. This is normal. When you produce too much mucus or the mucus gets too thick, you can feel it. Some common causes of postnasal drip include:  Having more mucus because of: ? A cold or the flu. ? Allergies. ? Cold air. ? Certain medicines.  Having more mucus that is thicker because of: ? A sinus or nasal infection. ? Dry air. ? A food allergy. Follow these instructions at home: Relieving discomfort   Gargle with a salt-water mixture 3-4 times a day or as needed. To make a salt-water mixture, completely dissolve -1 tsp of salt in 1 cup of warm water.  If the air in your home is dry, use a humidifier to add moisture to the air.  Use a saline spray or container (neti pot) to flush out the nose (nasal irrigation). These methods can help clear away mucus and keep the nasal passages moist. General instructions  Take over-the-counter and prescription medicines only as told by your health care provider.  Follow instructions from your health care provider about eating or drinking restrictions. You may need to avoid caffeine.  Avoid things that you know you are allergic to (allergens), like dust, mold, pollen, pets, or certain foods.  Drink enough fluid to keep your urine pale yellow.  Keep all follow-up visits as told by your health care provider. This is important. Contact a health care provider if:  You have a fever.  You have a sore throat.  You have difficulty swallowing.  You have headache.  You have sinus pain.  You have a cough that does not go away.  The mucus from your nose becomes thick and is green or yellow in color.  You have  cold or flu symptoms that last more than 10 days. Summary  Postnasal drip is the feeling of mucus going down the back of your throat.  If your health care provider approves, use nasal irrigation or a nasal spray 2?4 times a day.  Avoid things that you know you are allergic to (allergens), like dust, mold, pollen, pets, or certain foods. This information is not intended to replace advice given to you by your health care provider. Make sure you discuss any questions you have with your health care provider. Document Revised: 05/30/2018 Document Reviewed: 05/21/2016 Elsevier Patient Education  2020 Elsevier Inc.  

## 2019-10-27 NOTE — Progress Notes (Addendum)
Provider: Byrant Valent FNP-C   Gayland Curry, DO  Patient Care Team: Gayland Curry, DO as PCP - General (Geriatric Medicine) Thalia Bloodgood, OD as Referring Physician (Optometry)  Extended Emergency Contact Information Primary Emergency Contact: Pedigo,William Address: [address]          [place] Phone: [phone] Mobile Phone: [phone] Relation: Spouse  Code Status:  Full Code  Goals of care: Advanced Directive information Advanced Directives 10/27/2019  Does Patient Have a Medical Advance Directive? No  Would patient like information on creating a medical advance directive? No - Patient declined  Pre-existing out of facility DNR order (yellow form or pink MOST form) -     Chief Complaint  Patient presents with  . Medical Management of Chronic Issues    3 Month Follow Up.  Marland Kitchen Health Maintenance    Discuss the need for Eye Exam, Microalbumin, and Foot Exam.  . Immunizations    Discuss the need for Covid Vaccine and PNA Vaccine.    HPI:  Pt is a 54 y.o. female seen today for 3 months follow up for  medical management of chronic diseases.Has not had her COVID-19 vaccine states still thinking about it.Also declines PNA vaccine.  Hypertension - B/p 110's/70 - 120's/70 .she does not eat salt. She denies any signs hypotension.Marland Kitchenon HZCT 25 mg tablet daily.   Hyperlipidemia - she due for labs.tries to include veggies in diet.Not getting exercise due to taking care of the Aunt No taking Zetia due to Co-pay.  Type 2 DM - latest Hgb A1C 6.8 trying to eat healthy.Due for annual eye exam but has not been able to get appointment with her [facility] Doctor would like to switch eye doctors.will call with Ophthalmologist name. Also due for urine microalbumin.she was seen by podiatrist in 09/2019.No records for review but states nothing wrong with her feet.   Past Medical History:  Diagnosis Date  . Abdominal pain, right lower quadrant   .  Acute frontal sinusitis   . Acute upper respiratory infections of unspecified site   . Allergic rhinitis, cause unspecified   . Anemia, unspecified   . Anxiety state, unspecified   . Candidiasis of vulva and vagina   . Contact dermatitis and other eczema, due to unspecified cause   . Dizziness and giddiness   . Dysmenorrhea   . Dysuria   . Dysuria   . Edema   . Esophageal reflux   . Essential hypertension, benign   . External hemorrhoids without mention of complication   . Headache(784.0)   . Hyperpotassemia   . Hypopotassemia   . Iron deficiency anemia, unspecified   . Loss of weight   . Lumbago   . Neoplasm of uncertain behavior of skin   . Nontoxic uninodular goiter   . Other abnormal blood chemistry   . Other abnormal blood chemistry   . Other and unspecified hyperlipidemia   . Palpitations   . Pure hypercholesterolemia   . Reflux esophagitis   . Routine general medical examination at a health care facility   . Routine gynecological examination   . Shortness of breath   . Sprain of tibiofibular (ligament), distal of ankle   . Unspecified constipation   . Unspecified essential hypertension   . Unspecified pruritic disorder   . Urinary tract infection, site not specified    History reviewed. No pertinent surgical history.  Allergies  Allergen Reactions  . Fenofibrate Other (See Comments)    Only  took for one month after she developed difficulty urinating  . Crestor [Rosuvastatin] Other (See Comments)    Muscle cramps  . Lipitor [Atorvastatin] Other (See Comments)    Muscle cramps  . Metformin And Related Palpitations  . Pravachol [Pravastatin Sodium] Other (See Comments)    Muscle cramps in legs  . Other     Cranberries   . Penicillins     Pt was told by father  NOT to take Penicillins.  . Sulfa Antibiotics   . Vioxx [Rofecoxib]     Allergies as of 10/27/2019      Reactions   Fenofibrate Other (See Comments)   Only took for one month after she developed  difficulty urinating   Crestor [rosuvastatin] Other (See Comments)   Muscle cramps   Lipitor [atorvastatin] Other (See Comments)   Muscle cramps   Metformin And Related Palpitations   Pravachol [pravastatin Sodium] Other (See Comments)   Muscle cramps in legs   Other    Cranberries   Penicillins    Pt was told by father  NOT to take Penicillins.   Sulfa Antibiotics    Vioxx [rofecoxib]       Medication List       Accurate as of October 27, 2019  8:30 AM. If you have any questions, ask your nurse or doctor.        aspirin 81 MG EC tablet Take 1 tablet (81 mg total) by mouth daily. Swallow whole.   ezetimibe 10 MG tablet Commonly known as: ZETIA Take 1 tablet (10 mg total) by mouth daily.   fish oil-omega-3 fatty acids 1000 MG capsule Take by mouth daily.   hydrochlorothiazide 25 MG tablet Commonly known as: HYDRODIURIL TAKE 1 TABLET BY MOUTH EVERY DAY   polyethylene glycol powder 17 GM/SCOOP powder Commonly known as: GLYCOLAX/MIRALAX Take 17 g by mouth daily.   Trigels-F Forte 460-60-0.01-1 MG Caps capsule Generic drug: Fe Fum-Vit C-Vit B12-FA TAKE 1 CAPSULE BY MOUTH EVERY DAY       Review of Systems  Constitutional: Negative for appetite change, chills, fatigue and fever.  HENT: Negative for congestion, rhinorrhea, sinus pressure, sinus pain, sneezing, sore throat and trouble swallowing.   Eyes: Negative for discharge, redness, itching and visual disturbance.  Respiratory: Negative for cough, chest tightness, shortness of breath and wheezing.   Cardiovascular: Negative for chest pain, palpitations and leg swelling.  Gastrointestinal: Negative for abdominal distention, abdominal pain, constipation, diarrhea, nausea and vomiting.  Endocrine: Negative for cold intolerance, heat intolerance, polydipsia, polyphagia and polyuria.  Genitourinary: Negative for difficulty urinating, dysuria, flank pain, frequency and urgency.  Musculoskeletal: Negative for  arthralgias, gait problem, joint swelling and myalgias.  Skin: Negative for pallor and rash.  Neurological: Negative for dizziness, seizures, speech difficulty, weakness, light-headedness, numbness and headaches.  Hematological: Does not bruise/bleed easily.  Psychiatric/Behavioral: Negative for agitation, confusion and sleep disturbance. The patient is not nervous/anxious.     Immunization History  Administered Date(s) Administered  . Tdap 05/14/2011   Pertinent  Health Maintenance Due  Topic Date Due  . OPHTHALMOLOGY EXAM  07/22/2016  . FOOT EXAM  09/10/2018  . URINE MICROALBUMIN  12/08/2019  . HEMOGLOBIN A1C  12/08/2019  . PAP SMEAR-Modifier  05/28/2020  . MAMMOGRAM  01/01/2021  . COLONOSCOPY  08/15/2026   Fall Risk  10/27/2019 06/11/2019 12/08/2018 02/17/2018 09/09/2017  Falls in the past year? 0 0 0 0 No  Number falls in past yr: 0 0 - 0 -  Injury with Fall? 0  0 - 0 -    Vitals:   10/27/19 0824  BP: 102/70  Pulse: (!) 53  Resp: 16  Temp: 97.8 F (36.6 C)  SpO2: 97%  Weight: 170 lb 3.2 oz (77.2 kg)  Height: 5\' 7"  (1.702 m)   Body mass index is 26.66 kg/m. Physical Exam Vitals reviewed.  Constitutional:      General: She is not in acute distress.    Appearance: She is not ill-appearing.  HENT:     Head: Normocephalic.     Right Ear: Tympanic membrane, ear canal and external ear normal. There is no impacted cerumen.     Left Ear: Tympanic membrane, ear canal and external ear normal. There is no impacted cerumen.     Nose: Congestion present. No rhinorrhea.     Right Turbinates: Swollen. Not pale.     Left Turbinates: Swollen.     Right Sinus: No maxillary sinus tenderness or frontal sinus tenderness.     Left Sinus: No maxillary sinus tenderness or frontal sinus tenderness.     Mouth/Throat:     Mouth: Mucous membranes are moist.     Pharynx: Oropharynx is clear. No oropharyngeal exudate or posterior oropharyngeal erythema.  Eyes:     General: No scleral  icterus.       Right eye: No discharge.        Left eye: No discharge.     Extraocular Movements: Extraocular movements intact.     Conjunctiva/sclera: Conjunctivae normal.     Pupils: Pupils are equal, round, and reactive to light.  Neck:     Vascular: No carotid bruit.  Cardiovascular:     Rate and Rhythm: Normal rate and regular rhythm.     Pulses: Normal pulses.     Heart sounds: Normal heart sounds. No murmur heard.  No friction rub. No gallop.   Pulmonary:     Effort: Pulmonary effort is normal. No respiratory distress.     Breath sounds: Normal breath sounds. No wheezing, rhonchi or rales.  Chest:     Chest wall: No tenderness.  Abdominal:     General: Bowel sounds are normal. There is no distension.     Palpations: Abdomen is soft. There is no mass.     Tenderness: There is no abdominal tenderness. There is no right CVA tenderness, left CVA tenderness, guarding or rebound.  Musculoskeletal:        General: No swelling or tenderness. Normal range of motion.     Cervical back: Normal range of motion. No rigidity or tenderness.     Right lower leg: No edema.     Left lower leg: No edema.  Lymphadenopathy:     Cervical: No cervical adenopathy.  Skin:    General: Skin is warm.     Coloration: Skin is not pale.     Findings: No bruising, erythema or rash.  Neurological:     Mental Status: She is alert and oriented to person, place, and time.     Cranial Nerves: No cranial nerve deficit.     Sensory: No sensory deficit.     Motor: No weakness.     Coordination: Coordination normal.     Gait: Gait normal.     Deep Tendon Reflexes: Reflexes normal.  Psychiatric:        Mood and Affect: Mood normal.        Behavior: Behavior normal.        Thought Content: Thought content normal.  Judgment: Judgment normal.    Labs reviewed: Recent Labs    12/08/18 1502 06/08/19 0804  NA 141 143  K 3.6 4.4  CL 102 102  CO2 27 30  GLUCOSE 104 141*  BUN 16 15  CREATININE  0.98 0.65  CALCIUM 9.7 10.2   Recent Labs    06/08/19 0804  AST 27  ALT 61*  BILITOT 0.5  PROT 7.6   Recent Labs    12/08/18 1502 06/08/19 0804  WBC 7.6 5.8  NEUTROABS 4,583 2,877  HGB 13.2 14.3  HCT 39.4 42.6  MCV 83.1 84.2  PLT 297 264   Lab Results  Component Value Date   TSH 2.850 06/09/2012   Lab Results  Component Value Date   HGBA1C 6.8 (H) 06/08/2019   Lab Results  Component Value Date   CHOL 234 (H) 06/08/2019   HDL 52 06/08/2019   LDLCALC 137 (H) 06/08/2019   TRIG 313 (H) 06/08/2019   CHOLHDL 4.5 06/08/2019    Significant Diagnostic Results in last 30 days:  No results found.  Assessment/Plan 1. Essential hypertension, benign B/p at goal. Continue on Hydrochlorothiazide 25 mg tablet daily. - continue on ASA  - not on Statin due to allergies. CBC/diff,CMP was ordered prior to today's visit did not get it done.   2. Hyperlipidemia associated with type 2 diabetes mellitus ([place]) Latest LDL not at goal.Allergic to statin. Lipid panel ordered prior to today visit but did get it done.  3. Controlled type 2 diabetes mellitus without complication, without long-term current use of insulin (HCC) Lab Results  Component Value Date   HGBA1C 6.8 (H) 06/08/2019   No CBG for  Review. Continue on dietary modification.encourged exercise at least 30 minutes three times per week. Due for A1C today  - Microalbumin / creatinine urine ratio Due for annual eye exam but has not been able to get appointment with her [facility] Doctor would like to switch eye doctors.will call with Ophthalmologist name.  - foot exam up to date.   4. Other iron deficiency anemia Latest hgb stable.will D/c iron supplement if hgb remains stable.No bleeding reported.  5. Nasal congestion Bilateral nares congestion.No sinus tenderness on exam. - fluticasone (FLONASE) 50 MCG/ACT nasal spray; Place 2 sprays into both nostrils daily.  Dispense: 16 g; Refill: 6 - additonal Education information  provided on AVS   Family/ staff Communication: Reviewed plan of care with patient  Labs/tests ordered: CBC/diff,CMP,TSH level ,Hgb A1C and lipid panel  - Microalbumin / creatinine urine ratio  Next Appointment : 6 months for medical management of chronic issues.  Sandrea Hughs, NP

## 2019-10-28 LAB — CBC WITH DIFFERENTIAL/PLATELET
Absolute Monocytes: 444 cells/uL (ref 200–950)
Basophils Absolute: 60 cells/uL (ref 0–200)
Basophils Relative: 1 %
Eosinophils Absolute: 102 cells/uL (ref 15–500)
Eosinophils Relative: 1.7 %
HCT: 40.6 % (ref 35.0–45.0)
Hemoglobin: 14 g/dL (ref 11.7–15.5)
Lymphs Abs: 2322 cells/uL (ref 850–3900)
MCH: 29.3 pg (ref 27.0–33.0)
MCHC: 34.5 g/dL (ref 32.0–36.0)
MCV: 84.9 fL (ref 80.0–100.0)
MPV: 10.9 fL (ref 7.5–12.5)
Monocytes Relative: 7.4 %
Neutro Abs: 3072 cells/uL (ref 1500–7800)
Neutrophils Relative %: 51.2 %
Platelets: 276 10*3/uL (ref 140–400)
RBC: 4.78 10*6/uL (ref 3.80–5.10)
RDW: 13.3 % (ref 11.0–15.0)
Total Lymphocyte: 38.7 %
WBC: 6 10*3/uL (ref 3.8–10.8)

## 2019-10-28 LAB — COMPLETE METABOLIC PANEL WITH GFR
AG Ratio: 2.2 (calc) (ref 1.0–2.5)
ALT: 44 U/L — ABNORMAL HIGH (ref 6–29)
AST: 23 U/L (ref 10–35)
Albumin: 5 g/dL (ref 3.6–5.1)
Alkaline phosphatase (APISO): 42 U/L (ref 37–153)
BUN: 17 mg/dL (ref 7–25)
CO2: 28 mmol/L (ref 20–32)
Calcium: 10.5 mg/dL — ABNORMAL HIGH (ref 8.6–10.4)
Chloride: 101 mmol/L (ref 98–110)
Creat: 0.67 mg/dL (ref 0.50–1.05)
GFR, Est African American: 114 mL/min/{1.73_m2} (ref >=60)
GFR, Est Non African American: 98 mL/min/{1.73_m2} (ref >=60)
Globulin: 2.3 g/dL (calc) (ref 1.9–3.7)
Glucose, Bld: 128 mg/dL — ABNORMAL HIGH (ref 65–99)
Potassium: 4 mmol/L (ref 3.5–5.3)
Sodium: 140 mmol/L (ref 135–146)
Total Bilirubin: 0.5 mg/dL (ref 0.2–1.2)
Total Protein: 7.3 g/dL (ref 6.1–8.1)

## 2019-10-28 LAB — LIPID PANEL
Cholesterol: 246 mg/dL — ABNORMAL HIGH (ref <200)
HDL: 46 mg/dL — ABNORMAL LOW (ref >=50)
LDL Cholesterol (Calc): 143 mg/dL (calc) — ABNORMAL HIGH
Non-HDL Cholesterol (Calc): 200 mg/dL (calc) — ABNORMAL HIGH (ref <130)
Total CHOL/HDL Ratio: 5.3 (calc) — ABNORMAL HIGH (ref <5.0)
Triglycerides: 370 mg/dL — ABNORMAL HIGH (ref <150)

## 2019-10-28 LAB — MICROALBUMIN / CREATININE URINE RATIO
Creatinine, Urine: 115 mg/dL (ref 20–275)
Microalb Creat Ratio: 13 mcg/mg creat (ref <30)
Microalb, Ur: 1.5 mg/dL

## 2019-10-28 LAB — HEMOGLOBIN A1C
Hgb A1c MFr Bld: 6.7 % of total Hgb — ABNORMAL HIGH (ref <5.7)
Mean Plasma Glucose: 146 (calc)
eAG (mmol/L): 8.1 (calc)

## 2019-10-28 LAB — TSH: TSH: 2.47 mIU/L (ref 0.40–4.50)

## 2019-11-04 ENCOUNTER — Telehealth: Payer: Self-pay | Admitting: *Deleted

## 2019-11-04 DIAGNOSIS — D508 Other iron deficiency anemias: Secondary | ICD-10-CM

## 2019-11-04 DIAGNOSIS — I10 Essential (primary) hypertension: Secondary | ICD-10-CM

## 2019-11-04 DIAGNOSIS — E119 Type 2 diabetes mellitus without complications: Secondary | ICD-10-CM

## 2019-11-04 DIAGNOSIS — E785 Hyperlipidemia, unspecified: Secondary | ICD-10-CM

## 2019-11-04 MED ORDER — EZETIMIBE 10 MG PO TABS: 10.0000 mg | ORAL_TABLET | Freq: Every day | ORAL | 1 refills | Status: DC

## 2019-11-04 NOTE — Telephone Encounter (Signed)
-----   Message from Sandrea Hughs, NP sent at 11/02/2019  8:29 PM EDT ----- - No anemia or infection noted. - Hgb A1C slightly improved compared to previous level consistent with Prediabetes  - total cholesterol,triglycerides and LDL have worsen compared to previous level.Recommend restarting Zetia 10 mg tablet daily.Low carbohydrate,low saturated fats and high vegetable diet and exercise.  Will recheck CBC/diff,CMP,TSH level ,Hgb A1C and Lipid panel 2-4 days prior to your visit with Dr.Reed 04/26/2019

## 2019-11-04 NOTE — Telephone Encounter (Signed)
Patient notified and agreed.  Lab appointment scheduled for 04/19/2020 Orders placed for labs.  Rx sent to pharmacy, [facility].

## 2019-11-20 ENCOUNTER — Telehealth: Payer: Self-pay | Admitting: *Deleted

## 2019-11-20 NOTE — Telephone Encounter (Signed)
Received fax from [employer] for Prior Authorization for Hydrochlorothizide 25mg  daily.  Filled out form and faxed back to [facility] Fax: [phone]

## 2019-12-11 ENCOUNTER — Ambulatory Visit: Payer: BC Managed Care – PPO | Admitting: Podiatry

## 2020-02-02 ENCOUNTER — Other Ambulatory Visit: Payer: Self-pay | Admitting: Obstetrics and Gynecology

## 2020-02-02 DIAGNOSIS — R928 Other abnormal and inconclusive findings on diagnostic imaging of breast: Secondary | ICD-10-CM

## 2020-02-10 ENCOUNTER — Other Ambulatory Visit: Payer: Self-pay | Admitting: Internal Medicine

## 2020-02-15 ENCOUNTER — Other Ambulatory Visit: Payer: Self-pay

## 2020-02-15 ENCOUNTER — Ambulatory Visit
Admission: RE | Admit: 2020-02-15 | Discharge: 2020-02-15 | Disposition: A | Discharge status: Home / Self Care | Payer: BLUE CROSS/BLUE SHIELD | Source: Ambulatory Visit | Attending: Obstetrics and Gynecology | Admitting: Obstetrics and Gynecology

## 2020-02-15 DIAGNOSIS — R928 Other abnormal and inconclusive findings on diagnostic imaging of breast: Secondary | ICD-10-CM

## 2020-02-16 ENCOUNTER — Encounter: Payer: Self-pay | Admitting: Podiatry

## 2020-02-16 ENCOUNTER — Ambulatory Visit: Payer: BC Managed Care – PPO | Admitting: Podiatry

## 2020-02-16 DIAGNOSIS — E119 Type 2 diabetes mellitus without complications: Secondary | ICD-10-CM | POA: Diagnosis not present

## 2020-02-16 NOTE — Progress Notes (Signed)
Complaint:  Visit Type: Patient presents  to my office for a diabetic foot exam.. Complaint: Patient states her doctor recommended she receive a foot exam. Patient states she is having no pain or problems with her feet.  Patient has been diagnosed with DM with no foot complications.   Podiatric Exam: Vascular: dorsalis pedis and posterior tibial pulses are palpable bilateral. Capillary return is immediate. Temperature gradient is WNL. Skin turgor WNL  Sensorium: Normal Semmes Weinstein monofilament test. Normal tactile sensation bilaterally. Nail Exam: Normal nails with no evidence of drainage or infection. Ulcer Exam: There is no evidence of ulcer or pre-ulcerative changes or infection. Orthopedic Exam: Muscle tone and strength are WNL. No limitations in general ROM. No crepitus or effusions noted. Foot type and digits show no abnormalities. Bony prominences are unremarkable. Skin: No Porokeratosis. No infection or ulcers  Diagnosis:  Diabetes with no complications  Treatment & Plan Procedures and Treatment: Consent by patient was obtained for treatment procedures.   No evidence of vascular or neurologic pathology.   No ulceration, no infection noted.  Return Visit-Office Procedure: Patient instructed to return to the office for a follow up visit  Prn  for continued evaluation and treatment.    Helane Gunther DPM

## 2020-04-11 ENCOUNTER — Other Ambulatory Visit: Payer: Self-pay | Admitting: Family

## 2020-04-11 ENCOUNTER — Encounter: Payer: Self-pay | Admitting: Internal Medicine

## 2020-04-11 DIAGNOSIS — E785 Hyperlipidemia, unspecified: Secondary | ICD-10-CM

## 2020-04-11 DIAGNOSIS — E1169 Type 2 diabetes mellitus with other specified complication: Secondary | ICD-10-CM

## 2020-04-19 ENCOUNTER — Other Ambulatory Visit: Payer: BC Managed Care – PPO

## 2020-04-19 ENCOUNTER — Other Ambulatory Visit: Payer: Self-pay

## 2020-04-19 DIAGNOSIS — E1169 Type 2 diabetes mellitus with other specified complication: Secondary | ICD-10-CM

## 2020-04-19 DIAGNOSIS — I10 Essential (primary) hypertension: Secondary | ICD-10-CM

## 2020-04-19 DIAGNOSIS — E119 Type 2 diabetes mellitus without complications: Secondary | ICD-10-CM

## 2020-04-19 DIAGNOSIS — E785 Hyperlipidemia, unspecified: Secondary | ICD-10-CM

## 2020-04-19 DIAGNOSIS — D508 Other iron deficiency anemias: Secondary | ICD-10-CM

## 2020-04-20 LAB — CBC WITH DIFFERENTIAL/PLATELET
Absolute Monocytes: 378 cells/uL (ref 200–950)
Basophils Absolute: 49 cells/uL (ref 0–200)
Basophils Relative: 0.9 %
Eosinophils Absolute: 103 cells/uL (ref 15–500)
Eosinophils Relative: 1.9 %
HCT: 42.7 % (ref 35.0–45.0)
Hemoglobin: 14.6 g/dL (ref 11.7–15.5)
Lymphs Abs: 1890 cells/uL (ref 850–3900)
MCH: 29.3 pg (ref 27.0–33.0)
MCHC: 34.2 g/dL (ref 32.0–36.0)
MCV: 85.7 fL (ref 80.0–100.0)
MPV: 11.1 fL (ref 7.5–12.5)
Monocytes Relative: 7 %
Neutro Abs: 2981 cells/uL (ref 1500–7800)
Neutrophils Relative %: 55.2 %
Platelets: 284 10*3/uL (ref 140–400)
RBC: 4.98 10*6/uL (ref 3.80–5.10)
RDW: 13.1 % (ref 11.0–15.0)
Total Lymphocyte: 35 %
WBC: 5.4 10*3/uL (ref 3.8–10.8)

## 2020-04-20 LAB — LIPID PANEL
Cholesterol: 212 mg/dL — ABNORMAL HIGH (ref <200)
HDL: 54 mg/dL (ref >=50)
LDL Cholesterol (Calc): 123 mg/dL (calc) — ABNORMAL HIGH
Non-HDL Cholesterol (Calc): 158 mg/dL (calc) — ABNORMAL HIGH (ref <130)
Total CHOL/HDL Ratio: 3.9 (calc) (ref <5.0)
Triglycerides: 228 mg/dL — ABNORMAL HIGH (ref <150)

## 2020-04-20 LAB — COMPLETE METABOLIC PANEL WITH GFR
AG Ratio: 1.8 (calc) (ref 1.0–2.5)
ALT: 38 U/L — ABNORMAL HIGH (ref 6–29)
AST: 19 U/L (ref 10–35)
Albumin: 4.9 g/dL (ref 3.6–5.1)
Alkaline phosphatase (APISO): 46 U/L (ref 37–153)
BUN: 15 mg/dL (ref 7–25)
CO2: 31 mmol/L (ref 20–32)
Calcium: 10.1 mg/dL (ref 8.6–10.4)
Chloride: 101 mmol/L (ref 98–110)
Creat: 0.65 mg/dL (ref 0.50–1.05)
GFR, Est African American: 115 mL/min/{1.73_m2} (ref >=60)
GFR, Est Non African American: 99 mL/min/{1.73_m2} (ref >=60)
Globulin: 2.7 g/dL (calc) (ref 1.9–3.7)
Glucose, Bld: 109 mg/dL — ABNORMAL HIGH (ref 65–99)
Potassium: 4.3 mmol/L (ref 3.5–5.3)
Sodium: 140 mmol/L (ref 135–146)
Total Bilirubin: 0.6 mg/dL (ref 0.2–1.2)
Total Protein: 7.6 g/dL (ref 6.1–8.1)

## 2020-04-20 LAB — HEMOGLOBIN A1C
Hgb A1c MFr Bld: 6.6 % of total Hgb — ABNORMAL HIGH (ref <5.7)
Mean Plasma Glucose: 143 mg/dL
eAG (mmol/L): 7.9 mmol/L

## 2020-04-20 LAB — TSH: TSH: 2.91 mIU/L (ref 0.40–4.50)

## 2020-04-20 NOTE — Progress Notes (Signed)
Sugar average is slightly improved but still in diabetic range. Bad cholesterol remains above goal. Blood counts, thyroid and kidneys are ok. Her one liver test remains slightly elevated.  The others are normal.   We'll review at her visit.

## 2020-04-25 ENCOUNTER — Encounter: Payer: Self-pay | Admitting: Internal Medicine

## 2020-04-25 ENCOUNTER — Ambulatory Visit: Payer: BC Managed Care – PPO | Admitting: Internal Medicine

## 2020-04-25 ENCOUNTER — Other Ambulatory Visit: Payer: Self-pay

## 2020-04-25 VITALS — BP 110/80 | HR 71 | Temp 97.3°F | Ht 67.0 in | Wt 167.8 lb

## 2020-04-25 DIAGNOSIS — E119 Type 2 diabetes mellitus without complications: Secondary | ICD-10-CM | POA: Diagnosis not present

## 2020-04-25 DIAGNOSIS — D508 Other iron deficiency anemias: Secondary | ICD-10-CM | POA: Diagnosis not present

## 2020-04-25 DIAGNOSIS — E785 Hyperlipidemia, unspecified: Secondary | ICD-10-CM

## 2020-04-25 DIAGNOSIS — K5903 Drug induced constipation: Secondary | ICD-10-CM

## 2020-04-25 DIAGNOSIS — I10 Essential (primary) hypertension: Secondary | ICD-10-CM

## 2020-04-25 DIAGNOSIS — E1169 Type 2 diabetes mellitus with other specified complication: Secondary | ICD-10-CM

## 2020-04-25 NOTE — Progress Notes (Signed)
Location:  [facility] clinic Provider:  Joury Allcorn L. Mariea Clonts, D.O., C.M.D.  Goals of Care:  Advanced Directives 04/25/2020  Does Patient Have a Medical Advance Directive? No  Would patient like information on creating a medical advance directive? No - Patient declined  Pre-existing out of facility DNR order (yellow form or pink MOST form) -     Chief Complaint  Patient presents with  . Medical Management of Chronic Issues    6 month follow up.  Marland Kitchen Health Maintenance    COVID Vaccine,Pap smear    HPI: Patient is a 55 y.o. female seen today for medical management of chronic diseases.  Goes to gyn for paps.  Reviewed her labs:  She is eating a bit different to lower her sugar and cholesterol.  She'd been eating apples and cinnamon oatmeal--she's going to go back to regular cooked oatmeal.  She also has increased veggies--eating [place] burgers, chicken and staying away from red meat.  Down 2.2 lbs.   She's had muscle cramps with multiple statins and on zetia.    Has not done the covid vaccines.  She's a little afraid of it.  Counseled on getting covid vaccine series again.    She had her diabetic eye exam.  Had foot exam at podiatry.  She has to go pick up new eyeglass Rx.    Bowels are moving well with miralax.  The iron was the culprit.  Past Medical History:  Diagnosis Date  . Abdominal pain, right lower quadrant   . Acute frontal sinusitis   . Acute upper respiratory infections of unspecified site   . Allergic rhinitis, cause unspecified   . Anemia, unspecified   . Anxiety state, unspecified   . Candidiasis of vulva and vagina   . Contact dermatitis and other eczema, due to unspecified cause   . Dizziness and giddiness   . Dysmenorrhea   . Dysuria   . Dysuria   . Edema   . Esophageal reflux   . Essential hypertension, benign   . External hemorrhoids without mention of complication   . Headache(784.0)   . Hyperpotassemia   . Hypopotassemia   . Iron deficiency anemia,  unspecified   . Loss of weight   . Lumbago   . Neoplasm of uncertain behavior of skin   . Nontoxic uninodular goiter   . Other abnormal blood chemistry   . Other abnormal blood chemistry   . Other and unspecified hyperlipidemia   . Palpitations   . Pure hypercholesterolemia   . Reflux esophagitis   . Routine general medical examination at a health care facility   . Routine gynecological examination   . Shortness of breath   . Sprain of tibiofibular (ligament), distal of ankle   . Unspecified constipation   . Unspecified essential hypertension   . Unspecified pruritic disorder   . Urinary tract infection, site not specified     History reviewed. No pertinent surgical history.  Allergies  Allergen Reactions  . Fenofibrate Other (See Comments)    Only took for one month after she developed difficulty urinating  . Crestor [Rosuvastatin] Other (See Comments)    Muscle cramps  . Lipitor [Atorvastatin] Other (See Comments)    Muscle cramps  . Metformin And Related Palpitations  . Pravachol [Pravastatin Sodium] Other (See Comments)    Muscle cramps in legs  . Other     Cranberries   . Penicillins     Pt was told by father  NOT to take Penicillins.  Marland Kitchen  Sulfa Antibiotics   . Vioxx [Rofecoxib]     Outpatient Encounter Medications as of 04/25/2020  Medication Sig  . aspirin 81 MG EC tablet Take 1 tablet (81 mg total) by mouth daily. Swallow whole.  . cetirizine (ZYRTEC) 10 MG tablet Take 10 mg by mouth daily.  Marland Kitchen ezetimibe (ZETIA) 10 MG tablet Take 1 tablet (10 mg total) by mouth daily.  . fish oil-omega-3 fatty acids 1000 MG capsule Take by mouth daily.  . fluticasone (FLONASE) 50 MCG/ACT nasal spray Place 2 sprays into both nostrils daily.  . hydrochlorothiazide (HYDRODIURIL) 25 MG tablet TAKE 1 TABLET BY MOUTH EVERY DAY  . polyethylene glycol powder (GLYCOLAX/MIRALAX) powder Take 17 g by mouth daily.  . TRIGELS-F FORTE 460-60-0.01-1 MG CAPS capsule TAKE 1 CAPSULE BY MOUTH EVERY  DAY   No facility-administered encounter medications on file as of 04/25/2020.    Review of Systems:  Review of Systems  Constitutional: Negative for chills, fever and malaise/fatigue.  HENT: Negative for congestion, hearing loss and sore throat.   Eyes: Negative for blurred vision.  Respiratory: Negative for shortness of breath.   Cardiovascular: Negative for chest pain, palpitations and leg swelling.  Gastrointestinal: Negative for abdominal pain, blood in stool, constipation and melena.  Genitourinary: Negative for dysuria.  Musculoskeletal: Negative for falls and joint pain.  Neurological: Negative for dizziness and loss of consciousness.  Psychiatric/Behavioral: Negative for depression and memory loss. The patient is not nervous/anxious and does not have insomnia.     Health Maintenance  Topic Date Due  . COVID-19 Vaccine (1) Never done  . OPHTHALMOLOGY EXAM  07/22/2016  . PAP SMEAR-Modifier  05/28/2020  . PNEUMOCOCCAL POLYSACCHARIDE VACCINE AGE 58-64 HIGH RISK  10/26/2020 (Originally 10/16/1967)  . HEMOGLOBIN A1C  10/20/2020  . URINE MICROALBUMIN  10/26/2020  . MAMMOGRAM  01/01/2021  . FOOT EXAM  02/15/2021  . TETANUS/TDAP  05/13/2021  . COLONOSCOPY (Pts 45-85yrs Insurance coverage will need to be confirmed)  08/15/2026  . Hepatitis C Screening  Completed  . HIV Screening  Completed  . HPV VACCINES  Aged Out    Physical Exam: Vitals:   04/25/20 0901  BP: 110/80  Pulse: 71  Temp: (!) 97.3 F (36.3 C)  SpO2: 97%  Weight: 167 lb 12.8 oz (76.1 kg)  Height: 5\' 7"  (1.702 m)   Body mass index is 26.28 kg/m. Physical Exam Vitals reviewed.  Constitutional:      Appearance: Normal appearance.  Cardiovascular:     Rate and Rhythm: Normal rate and regular rhythm.     Pulses: Normal pulses.     Heart sounds: Normal heart sounds.  Pulmonary:     Effort: Pulmonary effort is normal.     Breath sounds: Normal breath sounds. No wheezing, rhonchi or rales.  Abdominal:      General: Bowel sounds are normal.  Musculoskeletal:        General: Normal range of motion.     Right lower leg: No edema.     Left lower leg: No edema.  Skin:    General: Skin is warm and dry.  Neurological:     General: No focal deficit present.     Mental Status: She is alert and oriented to person, place, and time.  Psychiatric:        Mood and Affect: Mood normal.        Behavior: Behavior normal.     Labs reviewed: Basic Metabolic Panel: Recent Labs    06/08/19 0804 10/27/19 0913 04/19/20  0000  NA 143 140 140  K 4.4 4.0 4.3  CL 102 101 101  CO2 30 28 31   GLUCOSE 141* 128* 109*  BUN 15 17 15   CREATININE 0.65 0.67 0.65  CALCIUM 10.2 10.5* 10.1  TSH  --  2.47 2.91   Liver Function Tests: Recent Labs    06/08/19 0804 10/27/19 0913 04/19/20 0000  AST 27 23 19   ALT 61* 44* 38*  BILITOT 0.5 0.5 0.6  PROT 7.6 7.3 7.6   No results for input(s): LIPASE, AMYLASE in the last 8760 hours. No results for input(s): AMMONIA in the last 8760 hours. CBC: Recent Labs    06/08/19 0804 10/27/19 0913 04/19/20 0000  WBC 5.8 6.0 5.4  NEUTROABS 2,877 3,072 2,981  HGB 14.3 14.0 14.6  HCT 42.6 40.6 42.7  MCV 84.2 84.9 85.7  PLT 264 276 284   Lipid Panel: Recent Labs    06/08/19 0804 10/27/19 0913 04/19/20 0000  CHOL 234* 246* 212*  HDL 52 46* 54  LDLCALC 137* 143* 123*  TRIG 313* 370* 228*  CHOLHDL 4.5 5.3* 3.9   Lab Results  Component Value Date   HGBA1C 6.6 (H) 04/19/2020    Assessment/Plan 1. Controlled type 2 diabetes mellitus without complication, without long-term current use of insulin (HCC) - has made some dietary changes since her husband passed away and she's been cooking for herself - CBC with Differential/Platelet; Future - COMPLETE METABOLIC PANEL WITH GFR; Future - Hemoglobin A1c; Future - Lipid panel; Future  2. Hyperlipidemia associated with type 2 diabetes mellitus ([place]) -provided additional education about low fat diet--has made some  great changes, also encouraged more physical activity with walking - Lipid panel; Future  3. Essential hypertension, benign -bp is controlled on current regimen, cont same and monitor - COMPLETE METABOLIC PANEL WITH GFR; Future  4. Other iron deficiency anemia - resolved -follows with gyn - CBC with Differential/Platelet; Future  5. Drug-induced constipation -continue miralax daily, but no longer taking iron supplement  Labs/tests ordered:   Lab Orders     CBC with Differential/Platelet     COMPLETE METABOLIC PANEL WITH GFR     Hemoglobin A1c     Lipid panel  Next appt:  6 mos med mgt with Amy, fasting labs before   Melinda Pottinger L. Leilana Mcquire, D.O. [facility] [address], [place] [address] Cell Phone (Mon-Fri 8am-5pm):  [phone] On Call:  [phone] & follow prompts after 5pm & weekends Office Phone:  [phone] Office Fax:  [phone]

## 2020-04-25 NOTE — Patient Instructions (Signed)
I recommend you get the covid vaccine series.

## 2020-05-04 ENCOUNTER — Other Ambulatory Visit: Payer: Self-pay | Admitting: Internal Medicine

## 2020-05-23 ENCOUNTER — Other Ambulatory Visit: Payer: Self-pay | Admitting: Family

## 2020-05-23 DIAGNOSIS — R0981 Nasal congestion: Secondary | ICD-10-CM

## 2020-08-05 ENCOUNTER — Other Ambulatory Visit: Payer: Self-pay | Admitting: *Deleted

## 2020-08-05 MED ORDER — HYDROCHLOROTHIAZIDE 25 MG PO TABS: 1.0000 | ORAL_TABLET | Freq: Every day | ORAL | 1 refills | Status: DC

## 2020-08-05 NOTE — Telephone Encounter (Signed)
[facility] requested refill.

## 2020-08-11 ENCOUNTER — Telehealth: Payer: Self-pay | Admitting: *Deleted

## 2020-08-11 NOTE — Telephone Encounter (Signed)
Received Prior Authorization from [facility] for Hydrochlorothiazide.  Filled out form and placed in Amy's folder to review and sign.   To be faxed back to [facility] Fax: [phone] once completed.

## 2020-10-14 ENCOUNTER — Other Ambulatory Visit: Payer: Self-pay

## 2020-10-14 DIAGNOSIS — E785 Hyperlipidemia, unspecified: Secondary | ICD-10-CM

## 2020-10-14 DIAGNOSIS — D508 Other iron deficiency anemias: Secondary | ICD-10-CM

## 2020-10-14 DIAGNOSIS — E663 Overweight: Secondary | ICD-10-CM

## 2020-10-14 DIAGNOSIS — I10 Essential (primary) hypertension: Secondary | ICD-10-CM

## 2020-10-14 DIAGNOSIS — E119 Type 2 diabetes mellitus without complications: Secondary | ICD-10-CM

## 2020-10-17 ENCOUNTER — Other Ambulatory Visit: Payer: Self-pay | Admitting: Orthopedic Surgery

## 2020-10-17 ENCOUNTER — Other Ambulatory Visit: Payer: BC Managed Care – PPO

## 2020-10-17 ENCOUNTER — Other Ambulatory Visit: Payer: Self-pay

## 2020-10-17 DIAGNOSIS — E119 Type 2 diabetes mellitus without complications: Secondary | ICD-10-CM

## 2020-10-17 DIAGNOSIS — E663 Overweight: Secondary | ICD-10-CM

## 2020-10-17 DIAGNOSIS — I10 Essential (primary) hypertension: Secondary | ICD-10-CM

## 2020-10-17 DIAGNOSIS — D508 Other iron deficiency anemias: Secondary | ICD-10-CM

## 2020-10-17 DIAGNOSIS — E1169 Type 2 diabetes mellitus with other specified complication: Secondary | ICD-10-CM

## 2020-10-18 LAB — CBC WITH DIFFERENTIAL/PLATELET
Absolute Monocytes: 366 cells/uL (ref 200–950)
Basophils Absolute: 60 cells/uL (ref 0–200)
Basophils Relative: 1 %
Eosinophils Absolute: 90 cells/uL (ref 15–500)
Eosinophils Relative: 1.5 %
HCT: 41.9 % (ref 35.0–45.0)
Hemoglobin: 13.8 g/dL (ref 11.7–15.5)
Lymphs Abs: 2178 cells/uL (ref 850–3900)
MCH: 29.3 pg (ref 27.0–33.0)
MCHC: 32.9 g/dL (ref 32.0–36.0)
MCV: 89 fL (ref 80.0–100.0)
MPV: 11.1 fL (ref 7.5–12.5)
Monocytes Relative: 6.1 %
Neutro Abs: 3306 cells/uL (ref 1500–7800)
Neutrophils Relative %: 55.1 %
Platelets: 268 10*3/uL (ref 140–400)
RBC: 4.71 10*6/uL (ref 3.80–5.10)
RDW: 12.8 % (ref 11.0–15.0)
Total Lymphocyte: 36.3 %
WBC: 6 10*3/uL (ref 3.8–10.8)

## 2020-10-18 LAB — COMPLETE METABOLIC PANEL WITH GFR
AG Ratio: 2.1 (calc) (ref 1.0–2.5)
ALT: 26 U/L (ref 6–29)
AST: 15 U/L (ref 10–35)
Albumin: 4.9 g/dL (ref 3.6–5.1)
Alkaline phosphatase (APISO): 44 U/L (ref 37–153)
BUN: 15 mg/dL (ref 7–25)
CO2: 30 mmol/L (ref 20–32)
Calcium: 9.8 mg/dL (ref 8.6–10.4)
Chloride: 103 mmol/L (ref 98–110)
Creat: 0.62 mg/dL (ref 0.50–1.03)
Globulin: 2.3 g/dL (calc) (ref 1.9–3.7)
Glucose, Bld: 127 mg/dL — ABNORMAL HIGH (ref 65–99)
Potassium: 4 mmol/L (ref 3.5–5.3)
Sodium: 141 mmol/L (ref 135–146)
Total Bilirubin: 0.5 mg/dL (ref 0.2–1.2)
Total Protein: 7.2 g/dL (ref 6.1–8.1)
eGFR: 104 mL/min/{1.73_m2} (ref >=60)

## 2020-10-18 LAB — HEMOGLOBIN A1C
Hgb A1c MFr Bld: 6.2 % of total Hgb — ABNORMAL HIGH (ref <5.7)
Mean Plasma Glucose: 131 mg/dL
eAG (mmol/L): 7.3 mmol/L

## 2020-10-18 LAB — LIPID PANEL
Cholesterol: 229 mg/dL — ABNORMAL HIGH (ref <200)
HDL: 47 mg/dL — ABNORMAL LOW (ref >=50)
LDL Cholesterol (Calc): 140 mg/dL (calc) — ABNORMAL HIGH
Non-HDL Cholesterol (Calc): 182 mg/dL (calc) — ABNORMAL HIGH (ref <130)
Total CHOL/HDL Ratio: 4.9 (calc) (ref <5.0)
Triglycerides: 276 mg/dL — ABNORMAL HIGH (ref <150)

## 2020-10-19 ENCOUNTER — Encounter: Payer: Self-pay | Admitting: Orthopedic Surgery

## 2020-10-20 ENCOUNTER — Encounter: Payer: Self-pay | Admitting: Orthopedic Surgery

## 2020-10-20 ENCOUNTER — Ambulatory Visit: Payer: BC Managed Care – PPO | Admitting: Orthopedic Surgery

## 2020-10-20 ENCOUNTER — Other Ambulatory Visit: Payer: Self-pay

## 2020-10-20 VITALS — BP 138/88 | HR 68 | Temp 97.1°F | Resp 16 | Ht 67.0 in | Wt 162.6 lb

## 2020-10-20 DIAGNOSIS — E119 Type 2 diabetes mellitus without complications: Secondary | ICD-10-CM

## 2020-10-20 DIAGNOSIS — E663 Overweight: Secondary | ICD-10-CM | POA: Diagnosis not present

## 2020-10-20 DIAGNOSIS — E785 Hyperlipidemia, unspecified: Secondary | ICD-10-CM

## 2020-10-20 DIAGNOSIS — I10 Essential (primary) hypertension: Secondary | ICD-10-CM

## 2020-10-20 DIAGNOSIS — E1169 Type 2 diabetes mellitus with other specified complication: Secondary | ICD-10-CM | POA: Diagnosis not present

## 2020-10-20 DIAGNOSIS — K5903 Drug induced constipation: Secondary | ICD-10-CM

## 2020-10-20 MED ORDER — EZETIMIBE 10 MG PO TABS
10.0000 mg | ORAL_TABLET | Freq: Every day | ORAL | 1 refills | Status: AC
Start: 2020-10-20 — End: ?

## 2020-10-20 NOTE — Progress Notes (Signed)
Careteam: Patient Care Team: Yvonna Alanis, NP as PCP - General (Adult Health Nurse Practitioner) Thalia Bloodgood, OD as Referring Physician (Optometry)  Seen by: Windell Moulding, AGNP-C  PLACE OF SERVICE:  [place] Directive information Does Patient Have a Medical Advance Directive?: No, Would patient like information on creating a medical advance directive?: No - Patient declined  Allergies  Allergen Reactions   Fenofibrate Other (See Comments)    Only took for one month after she developed difficulty urinating   Crestor [Rosuvastatin] Other (See Comments)    Muscle cramps   Lipitor [Atorvastatin] Other (See Comments)    Muscle cramps   Metformin And Related Palpitations   Pravachol [Pravastatin Sodium] Other (See Comments)    Muscle cramps in legs   Other     Cranberries    Penicillins     Pt was told by father  NOT to take Penicillins.   Sulfa Antibiotics    Vioxx [Rofecoxib]     Chief Complaint  Patient presents with   Medical Management of Chronic Issues    6 month follow up.   Health Maintenance    Discuss the need for pap smear, and eye exam.    Immunizations    Discuss the need for shingrix vaccine, and covid vaccine.     HPI: Patient is a 55 y.o. female seen today for medical management of chronic conditions.   Labs reviewed with patient.   Discussed elevated cholesterol results. She reports diet high in fats. She reports eating a lot of frozen options. She also just ate a big steak for her birthday prior to labs being drawn. She was not taking her Zetia due to a few leg cramps. She is willing to retry her Zetia daily, and will switch to every other day if she begins to have symptoms. Risk factors of high cholesterol, including cardiac event and stroke discussed with patient.   Continues to take allergy medicine due to dust and pollen.   Reports one episode of constipation in past month.   She went to see her eye doctor in February.   She  plans to have PAP and mammogram this November 2022. She is followed by [facility].   Discussed getting shingles shot.   Refusing to have second covid booster vaccine and flu vaccine today.   Review of Systems:  Review of Systems  Constitutional:  Negative for chills, fever, malaise/fatigue and weight loss.  HENT: Negative.    Eyes: Negative.   Respiratory:  Negative for cough, shortness of breath and wheezing.   Cardiovascular:  Negative for chest pain and leg swelling.  Gastrointestinal:  Negative for abdominal pain, blood in stool, constipation, diarrhea, heartburn, nausea and vomiting.  Genitourinary: Negative.   Musculoskeletal: Negative.   Neurological: Negative.   Psychiatric/Behavioral:  Negative for depression. The patient is not nervous/anxious and does not have insomnia.    Past Medical History:  Diagnosis Date   Abdominal pain, right lower quadrant    Acute frontal sinusitis    Acute upper respiratory infections of unspecified site    Allergic rhinitis, cause unspecified    Anemia, unspecified    Anxiety state, unspecified    Candidiasis of vulva and vagina    Contact dermatitis and other eczema, due to unspecified cause    Dizziness and giddiness    Dysmenorrhea    Dysuria    Dysuria    Edema    Esophageal reflux    Essential hypertension, benign  External hemorrhoids without mention of complication    [id number])    Hyperpotassemia    Hypopotassemia    Iron deficiency anemia, unspecified    Loss of weight    Lumbago    Neoplasm of uncertain behavior of skin    Nontoxic uninodular goiter    Other abnormal blood chemistry    Other abnormal blood chemistry    Other and unspecified hyperlipidemia    Palpitations    Pure hypercholesterolemia    Reflux esophagitis    Routine general medical examination at a health care facility    Routine gynecological examination    Shortness of breath    Sprain of tibiofibular (ligament), distal of ankle     Unspecified constipation    Unspecified essential hypertension    Unspecified pruritic disorder    Urinary tract infection, site not specified    History reviewed. No pertinent surgical history. Social History:   reports that she has never smoked. She has never used smokeless tobacco. She reports that she does not drink alcohol and does not use drugs.  History reviewed. No pertinent family history.  Medications: Patient's Medications  New Prescriptions   No medications on file  Previous Medications   ASPIRIN 81 MG EC TABLET    Take 1 tablet (81 mg total) by mouth daily. Swallow whole.   CETIRIZINE (ZYRTEC) 10 MG TABLET    Take 10 mg by mouth daily.   FISH OIL-OMEGA-3 FATTY ACIDS 1000 MG CAPSULE    Take by mouth daily.   FLUTICASONE (FLONASE) 50 MCG/ACT NASAL SPRAY    SPRAY 2 SPRAYS INTO EACH NOSTRIL EVERY DAY   HYDROCHLOROTHIAZIDE (HYDRODIURIL) 25 MG TABLET    Take 1 tablet (25 mg total) by mouth daily.   POLYETHYLENE GLYCOL POWDER (GLYCOLAX/MIRALAX) POWDER    Take 17 g by mouth daily.   TRIGELS-F FORTE 460-60-0.01-1 MG CAPS CAPSULE    TAKE 1 CAPSULE BY MOUTH EVERY DAY  Modified Medications   No medications on file  Discontinued Medications   EZETIMIBE (ZETIA) 10 MG TABLET    Take 1 tablet (10 mg total) by mouth daily.    Physical Exam:  Vitals:   10/20/20 0904  BP: 138/88  Pulse: 68  Resp: 16  Temp: (!) 97.1 F (36.2 C)  SpO2: 98%  Weight: 162 lb 9.6 oz (73.8 kg)  Height: '5\' 7"'$  (1.702 m)   Body mass index is 25.47 kg/m. Wt Readings from Last 3 Encounters:  10/20/20 162 lb 9.6 oz (73.8 kg)  04/25/20 167 lb 12.8 oz (76.1 kg)  10/27/19 170 lb 3.2 oz (77.2 kg)    Physical Exam Vitals reviewed.  Constitutional:      General: She is not in acute distress. HENT:     Head: Normocephalic.  Eyes:     General:        Right eye: No discharge.        Left eye: No discharge.  Neck:     Thyroid: No thyroid mass or thyromegaly.     Vascular: No carotid bruit.   Cardiovascular:     Rate and Rhythm: Normal rate and regular rhythm.     Pulses: Normal pulses.     Heart sounds: Normal heart sounds. No murmur heard. Pulmonary:     Effort: Pulmonary effort is normal. No respiratory distress.     Breath sounds: Normal breath sounds. No wheezing.  Abdominal:     General: Abdomen is flat. Bowel sounds are normal. There is no distension.  Palpations: Abdomen is soft.     Tenderness: There is no abdominal tenderness.  Musculoskeletal:     Cervical back: Normal range of motion.     Right lower leg: No edema.     Left lower leg: No edema.  Lymphadenopathy:     Cervical: No cervical adenopathy.  Skin:    General: Skin is warm and dry.     Capillary Refill: Capillary refill takes less than 2 seconds.  Neurological:     General: No focal deficit present.     Mental Status: She is alert and oriented to person, place, and time.  Psychiatric:        Mood and Affect: Mood normal.        Behavior: Behavior normal.    Labs reviewed: Basic Metabolic Panel: Recent Labs    10/27/19 0913 04/19/20 0000 10/17/20 0809  NA 140 140 141  K 4.0 4.3 4.0  CL 101 101 103  CO2 '28 31 30  '$ GLUCOSE 128* 109* 127*  BUN '17 15 15  '$ CREATININE 0.67 0.65 0.62  CALCIUM 10.5* 10.1 9.8  TSH 2.47 2.91  --    Liver Function Tests: Recent Labs    10/27/19 0913 04/19/20 0000 10/17/20 0809  AST '23 19 15  '$ ALT 44* 38* 26  BILITOT 0.5 0.6 0.5  PROT 7.3 7.6 7.2   No results for input(s): LIPASE, AMYLASE in the last 8760 hours. No results for input(s): AMMONIA in the last 8760 hours. CBC: Recent Labs    10/27/19 0913 04/19/20 0000 10/17/20 0809  WBC 6.0 5.4 6.0  NEUTROABS 3,072 2,981 3,306  HGB 14.0 14.6 13.8  HCT 40.6 42.7 41.9  MCV 84.9 85.7 89.0  PLT 276 284 268   Lipid Panel: Recent Labs    10/27/19 0913 04/19/20 0000 10/17/20 0809  CHOL 246* 212* 229*  HDL 46* 54 47*  LDLCALC 143* 123* 140*  TRIG 370* 228* 276*  CHOLHDL 5.3* 3.9 4.9    TSH: Recent Labs    10/27/19 0913 04/19/20 0000  TSH 2.47 2.91   A1C: Lab Results  Component Value Date   HGBA1C 6.2 (H) 10/17/2020     Assessment/Plan 1. Controlled type 2 diabetes mellitus without complication, without long-term current use of insulin (HCC) - a1c 6.2 10/17/2020 - continue low carb diet - Microalbumin/Creatinine Ratio, Urine - A1c - future - cmp- future  2. Hyperlipidemia associated with type 2 diabetes mellitus ([place]) - LDL 140 10/17/2020 - she reports stopping Zetia the past few months due to a few cramps - she is willing to retry medication - past history of statin intolerance - discussed low fat diet and reducing processed foods - ezetimibe (ZETIA) 10 MG tablet; Take 1 tablet (10 mg total) by mouth daily.  Dispense: 90 tablet; Refill: 1 - lipid panel- future  3. Essential hypertension, benign - controlled - cont HCTZ - cmp- future  4. Overweight with body mass index (BMI) 25.0-29.9 - she has lost 5 lbs since last encounter - recommend calorie counting, < 1500 calories/day  5. Drug-induced constipation - reports one episode of constipation in last month - abdomen soft, no distension noted - cont miralax daily - recommend hydration with water daily  Total time: 34 minutes. Greater than 50% of total time discussing low fat diet and medication management.   Next appt: Visit date not found  [place], [facility] [phone]

## 2020-10-20 NOTE — Patient Instructions (Signed)
Look up mediterranean diet- this diet is good for high cholesterol  Try zetia daily, if you have cramps then try every other day.

## 2020-10-21 LAB — MICROALBUMIN / CREATININE URINE RATIO
Creatinine, Urine: 54 mg/dL (ref 20–275)
Microalb Creat Ratio: 7 mcg/mg creat (ref <30)
Microalb, Ur: 0.4 mg/dL

## 2020-10-25 ENCOUNTER — Other Ambulatory Visit: Payer: BC Managed Care – PPO

## 2020-10-27 ENCOUNTER — Ambulatory Visit: Payer: BC Managed Care – PPO | Admitting: Orthopedic Surgery

## 2020-10-28 ENCOUNTER — Ambulatory Visit: Payer: BC Managed Care – PPO | Admitting: Orthopedic Surgery

## 2021-01-31 ENCOUNTER — Other Ambulatory Visit: Payer: Self-pay | Admitting: *Deleted

## 2021-01-31 MED ORDER — TRIGELS-F FORTE 460-60-0.01-1 MG PO CAPS: 1.0000 | ORAL_CAPSULE | Freq: Every day | ORAL | 6 refills | Status: DC

## 2021-01-31 NOTE — Telephone Encounter (Signed)
Pharmacy requested refill

## 2021-02-08 ENCOUNTER — Other Ambulatory Visit: Payer: Self-pay | Admitting: Orthopedic Surgery

## 2021-02-08 ENCOUNTER — Other Ambulatory Visit: Payer: Self-pay | Admitting: Family

## 2021-02-08 DIAGNOSIS — R0981 Nasal congestion: Secondary | ICD-10-CM

## 2021-03-08 DIAGNOSIS — Z6826 Body mass index (BMI) 26.0-26.9, adult: Secondary | ICD-10-CM | POA: Diagnosis not present

## 2021-03-08 DIAGNOSIS — Z1231 Encounter for screening mammogram for malignant neoplasm of breast: Secondary | ICD-10-CM | POA: Diagnosis not present

## 2021-03-08 DIAGNOSIS — Z01419 Encounter for gynecological examination (general) (routine) without abnormal findings: Secondary | ICD-10-CM | POA: Diagnosis not present

## 2021-03-08 LAB — HM PAP SMEAR

## 2021-03-10 ENCOUNTER — Other Ambulatory Visit: Payer: Self-pay

## 2021-03-10 ENCOUNTER — Ambulatory Visit (INDEPENDENT_AMBULATORY_CARE_PROVIDER_SITE_OTHER): Payer: BC Managed Care – PPO | Admitting: Adult Health

## 2021-03-10 ENCOUNTER — Encounter: Payer: Self-pay | Admitting: Adult Health

## 2021-03-10 VITALS — BP 120/80 | HR 92 | Temp 98.4°F

## 2021-03-10 DIAGNOSIS — R051 Acute cough: Secondary | ICD-10-CM

## 2021-03-10 DIAGNOSIS — R509 Fever, unspecified: Secondary | ICD-10-CM

## 2021-03-10 LAB — POCT INFLUENZA A/B
Influenza A, POC: NEGATIVE
Influenza B, POC: NEGATIVE

## 2021-03-10 MED ORDER — ZINC SULFATE 220 (50 ZN) MG PO TABS: 220.0000 mg | ORAL_TABLET | Freq: Every day | ORAL | 0 refills | Status: AC

## 2021-03-10 MED ORDER — VITAMIN D3 50 MCG (2000 UT) PO CAPS: 2000.0000 [IU] | ORAL_CAPSULE | Freq: Every day | ORAL | 0 refills | Status: AC

## 2021-03-10 MED ORDER — VITAMIN C 1000 MG PO TABS: 1000.0000 mg | ORAL_TABLET | Freq: Every day | ORAL | 0 refills | Status: AC

## 2021-03-10 MED ORDER — GUAIFENESIN ER 600 MG PO TB12: 600.0000 mg | ORAL_TABLET | Freq: Two times a day (BID) | ORAL | 0 refills | Status: AC

## 2021-03-10 NOTE — Progress Notes (Signed)
[facility] clinic  Provider:   Durenda Age  DNP  Code Status:  Full Code  Goals of Care:  Advanced Directives 10/19/2020  Does Patient Have a Medical Advance Directive? No  Would patient like information on creating a medical advance directive? No - Patient declined  Pre-existing out of facility DNR order (yellow form or pink MOST form) -     Chief Complaint  Patient presents with   Acute Visit    Fever, cough, body chills started last night. Patient has not done a covid test.    HPI: Patient is a 56 y.o. female seen today for an acute visit for fever and cough. She had temperature 101.0 last night and this morning. She has taken Tylenol and temperature in the clinic was 98.4. She started coughing last night with whitish phlegm. She stated that her appetite is decreased. No SOB nor wheezing.She is unvaccinated with COVID-19. No known exposure to anybody sick. She lives in the house by herself. She goes to work and [employer] only. She was tested for Influenza A/B and COVID-19. Influenza A/B test was negative.  Past Medical History:  Diagnosis Date   Abdominal pain, right lower quadrant    Acute frontal sinusitis    Acute upper respiratory infections of unspecified site    Allergic rhinitis, cause unspecified    Anemia, unspecified    Anxiety state, unspecified    Candidiasis of vulva and vagina    Contact dermatitis and other eczema, due to unspecified cause    Dizziness and giddiness    Dysmenorrhea    Dysuria    Dysuria    Edema    Esophageal reflux    Essential hypertension, benign    External hemorrhoids without mention of complication    SWNIOEVO(350.0)    Hyperpotassemia    Hypopotassemia    Iron deficiency anemia, unspecified    Loss of weight    Lumbago    Neoplasm of uncertain behavior of skin    Nontoxic uninodular goiter    Other abnormal blood chemistry    Other abnormal blood chemistry    Other and unspecified hyperlipidemia    Palpitations     Pure hypercholesterolemia    Reflux esophagitis    Routine general medical examination at a health care facility    Routine gynecological examination    Shortness of breath    Sprain of tibiofibular (ligament), distal of ankle    Unspecified constipation    Unspecified essential hypertension    Unspecified pruritic disorder    Urinary tract infection, site not specified     History reviewed. No pertinent surgical history.  Allergies  Allergen Reactions   Fenofibrate Other (See Comments)    Only took for one month after she developed difficulty urinating   Crestor [Rosuvastatin] Other (See Comments)    Muscle cramps   Lipitor [Atorvastatin] Other (See Comments)    Muscle cramps   Metformin And Related Palpitations   Pravachol [Pravastatin Sodium] Other (See Comments)    Muscle cramps in legs   Other     Cranberries    Penicillins     Pt was told by father  NOT to take Penicillins.   Sulfa Antibiotics    Vioxx [Rofecoxib]     Outpatient Encounter Medications as of 03/10/2021  Medication Sig   aspirin 81 MG EC tablet Take 1 tablet (81 mg total) by mouth daily. Swallow whole.   cetirizine (ZYRTEC) 10 MG tablet Take 10 mg by mouth daily.  ezetimibe (ZETIA) 10 MG tablet Take 1 tablet (10 mg total) by mouth daily.   Fe Fum-Vit C-Vit B12-FA (TRIGELS-F FORTE) 460-60-0.01-1 MG CAPS capsule Take 1 capsule by mouth daily.   fish oil-omega-3 fatty acids 1000 MG capsule Take by mouth daily.   fluticasone (FLONASE) 50 MCG/ACT nasal spray SPRAY 2 SPRAYS INTO EACH NOSTRIL EVERY DAY   hydrochlorothiazide (HYDRODIURIL) 25 MG tablet TAKE 1 TABLET (25 MG TOTAL) BY MOUTH DAILY.   polyethylene glycol powder (GLYCOLAX/MIRALAX) powder Take 17 g by mouth daily.   No facility-administered encounter medications on file as of 03/10/2021.    Review of Systems:  Review of Systems  Constitutional:  Positive for appetite change, chills and fever. Negative for activity change.  HENT:  Negative for  congestion and sore throat.   Respiratory:  Positive for cough. Negative for shortness of breath and wheezing.   Gastrointestinal:  Negative for diarrhea and vomiting.  Genitourinary:  Negative for dysuria.  Musculoskeletal:  Negative for arthralgias and joint swelling.  Skin:  Negative for color change and rash.  Neurological:  Negative for headaches.  Psychiatric/Behavioral:  Negative for confusion. The patient is not nervous/anxious.    Health Maintenance  Topic Date Due   COVID-19 Vaccine (1) Never done   Zoster Vaccines- Shingrix (1 of 2) Never done   PAP SMEAR-Modifier  05/28/2020   MAMMOGRAM  01/01/2021   FOOT EXAM  02/15/2021   OPHTHALMOLOGY EXAM  03/22/2021   HEMOGLOBIN A1C  04/18/2021   TETANUS/TDAP  05/13/2021   URINE MICROALBUMIN  10/20/2021   COLONOSCOPY (Pts 45-19yrs Insurance coverage will need to be confirmed)  08/15/2026   Hepatitis C Screening  Completed   HIV Screening  Completed   HPV VACCINES  Aged Out    Physical Exam: Vitals:   03/10/21 1414  BP: 120/80  Pulse: 92  Temp: 98.4 F (36.9 C)  SpO2: 97%   There is no height or weight on file to calculate BMI. Physical Exam Constitutional:      Appearance: Normal appearance.  HENT:     Head: Normocephalic and atraumatic.     Nose: Nose normal.  Eyes:     Conjunctiva/sclera: Conjunctivae normal.  Cardiovascular:     Rate and Rhythm: Normal rate and regular rhythm.  Pulmonary:     Effort: Pulmonary effort is normal.     Breath sounds: Normal breath sounds.  Abdominal:     Palpations: Abdomen is soft.  Musculoskeletal:        General: No swelling. Normal range of motion.     Cervical back: Normal range of motion.  Skin:    General: Skin is warm and dry.  Neurological:     General: No focal deficit present.     Mental Status: She is alert and oriented to person, place, and time.    Labs reviewed: Basic Metabolic Panel: Recent Labs    04/19/20 0000 10/17/20 0809  NA 140 141  K 4.3 4.0   CL 101 103  CO2 31 30  GLUCOSE 109* 127*  BUN 15 15  CREATININE 0.65 0.62  CALCIUM 10.1 9.8  TSH 2.91  --    Liver Function Tests: Recent Labs    04/19/20 0000 10/17/20 0809  AST 19 15  ALT 38* 26  BILITOT 0.6 0.5  PROT 7.6 7.2   No results for input(s): LIPASE, AMYLASE in the last 8760 hours. No results for input(s): AMMONIA in the last 8760 hours. CBC: Recent Labs    04/19/20 0000 10/17/20  0809  WBC 5.4 6.0  NEUTROABS 2,981 3,306  HGB 14.6 13.8  HCT 42.7 41.9  MCV 85.7 89.0  PLT 284 268   Lipid Panel: Recent Labs    04/19/20 0000 10/17/20 0809  CHOL 212* 229*  HDL 54 47*  LDLCALC 123* 140*  TRIG 228* 276*  CHOLHDL 3.9 4.9   Lab Results  Component Value Date   HGBA1C 6.2 (H) 10/17/2020    Procedures since last visit: No results found.  Assessment/Plan  1. Fever in adult -  continue Acetaminophen PRN - POC Influenza A/B, was negative  2. Acute cough - SARS-COV-2 RNA,(COVID-19) QUAL NAAT - Zinc Sulfate 220 (50 Zn) MG TABS; Take 1 tablet (220 mg total) by mouth daily for 10 days.  Dispense: 10 tablet; Refill: 0 - Ascorbic Acid (VITAMIN C) 1000 MG tablet; Take 1 tablet (1,000 mg total) by mouth daily for 10 days.  Dispense: 10 tablet; Refill: 0 - Cholecalciferol (VITAMIN D3) 50 MCG (2000 UT) capsule; Take 1 capsule (2,000 Units total) by mouth daily.  Dispense: 30 capsule; Refill: 0 - guaiFENesin (MUCINEX) 600 MG 12 hr tablet; Take 1 tablet (600 mg total) by mouth 2 (two) times daily for 14 days.  Dispense: 28 tablet; Refill: 0   Labs/tests ordered:  POC influenza A/B and SARS-COV-2 RNA,(COVID-19) QUAL NAAT  Next appt:  04/17/2021

## 2021-03-10 NOTE — Patient Instructions (Signed)
Fever, Adult   A fever is an increase in your body's temperature. It often means a temperature of 100.36F (38C) or higher. Brief mild or moderate fevers often have no long-term effects. They often do not need treatment. Moderate or high fevers may make you feel uncomfortable. Sometimes, they can be a sign of a serious illness or disease. A fever that keeps coming back or that lasts a long time may cause you to lose water in your body (get dehydrated). You can take your temperature with a thermometer to see if you have a fever. Temperature can change with: Age. Time of day. Where the thermometer is put in the body. Readings may vary when the thermometer is put: In the mouth (oral). In the butt (rectal). In the ear (tympanic). Under the arm (axillary). On the forehead (temporal). Follow these instructions at home: Medicines Take over-the-counter and prescription medicines only as told by your doctor. Follow the dosing instructions carefully. If you were prescribed an antibiotic medicine, take it as told by your doctor. Do not stop taking it even if you start to feel better. General instructions Watch for any changes in your symptoms. Tell your doctor about them. Rest as needed. Drink enough fluid to keep your pee (urine) pale yellow. Sponge yourself or bathe with room-temperature water as needed. This helps to lower your body temperature. Do not use ice water. Do not use too many blankets or wear clothes that are too heavy. If your fever was caused by an infection that spreads from person to person (is contagious), such as a cold or the flu: You should stay home from work and public places for at least 24 hours after your fever is gone. Your fever should be gone for at least 24 hours without the need to use medicines. Contact a doctor if: You throw up (vomit). You cannot eat or drink without throwing up. You have watery poop (diarrhea). It hurts when you pee. Your symptoms do not get  better with treatment. You have new symptoms. You feel very weak. Get help right away if: You are short of breath or have trouble breathing. You are dizzy or you pass out (faint). You feel mixed up (confused). You have signs of not having enough water in your body, such as: Dark pee, very little pee, or no pee. Cracked lips. Dry mouth. Sunken eyes. Sleepiness. Weakness. You have very bad pain in your belly (abdomen). You keep throwing up or having watery poop. You have a rash on your skin. Your symptoms get worse all of a sudden. Summary A fever is an increase in your body's temperature. It often means a temperature of 100.36F (38C) or higher. Watch for any changes in your symptoms. Tell your doctor about them. Take all medicines only as told by your doctor. Do not go to work or other public places if your fever was caused by an illness that can spread to other people. Get help right away if you have signs that you do not have enough water in your body. This information is not intended to replace advice given to you by your health care provider. Make sure you discuss any questions you have with your health care provider. Document Revised: 06/28/2020 Document Reviewed: 06/28/2020 Elsevier Patient Education  [employer].

## 2021-03-11 ENCOUNTER — Other Ambulatory Visit: Payer: Self-pay | Admitting: Adult Health

## 2021-03-11 LAB — SARS-COV-2 RNA,(COVID-19) QUALITATIVE NAAT: SARS CoV2 RNA: DETECTED — AB

## 2021-03-11 MED ORDER — MOLNUPIRAVIR EUA 200MG CAPSULE: 4.0000 | ORAL_CAPSULE | Freq: Two times a day (BID) | ORAL | 0 refills | Status: AC

## 2021-03-11 NOTE — Progress Notes (Signed)
Sent prescription for Molnupiravir to pharmacy due to positive COVID-19 test.

## 2021-03-15 ENCOUNTER — Telehealth: Payer: Self-pay | Admitting: *Deleted

## 2021-03-15 NOTE — Telephone Encounter (Signed)
Patient was seen 03/10/2021 and tested Positive for COVID.  Patient has been out of work since Friday and won't be returning till Monday 1/30. Works [employer].   Needs Work note for 03/10/2021-03/17/2021   Would like it faxed to her work at Fax:[phone] Attn: Cindi Carbon.   Please Advise.

## 2021-03-16 NOTE — Telephone Encounter (Signed)
Work Note faxed to [employer].

## 2021-04-17 ENCOUNTER — Other Ambulatory Visit: Payer: BC Managed Care – PPO

## 2021-04-17 ENCOUNTER — Other Ambulatory Visit: Payer: Self-pay

## 2021-04-17 DIAGNOSIS — I10 Essential (primary) hypertension: Secondary | ICD-10-CM

## 2021-04-17 DIAGNOSIS — E785 Hyperlipidemia, unspecified: Secondary | ICD-10-CM | POA: Diagnosis not present

## 2021-04-17 DIAGNOSIS — E1169 Type 2 diabetes mellitus with other specified complication: Secondary | ICD-10-CM

## 2021-04-17 DIAGNOSIS — E119 Type 2 diabetes mellitus without complications: Secondary | ICD-10-CM

## 2021-04-18 LAB — LIPID PANEL
Cholesterol: 227 mg/dL — ABNORMAL HIGH (ref <200)
HDL: 49 mg/dL — ABNORMAL LOW (ref >=50)
LDL Cholesterol (Calc): 133 mg/dL (calc) — ABNORMAL HIGH
Non-HDL Cholesterol (Calc): 178 mg/dL (calc) — ABNORMAL HIGH (ref <130)
Total CHOL/HDL Ratio: 4.6 (calc) (ref <5.0)
Triglycerides: 315 mg/dL — ABNORMAL HIGH (ref <150)

## 2021-04-18 LAB — COMPREHENSIVE METABOLIC PANEL
AG Ratio: 1.9 (calc) (ref 1.0–2.5)
ALT: 25 U/L (ref 6–29)
AST: 15 U/L (ref 10–35)
Albumin: 4.9 g/dL (ref 3.6–5.1)
Alkaline phosphatase (APISO): 39 U/L (ref 37–153)
BUN: 14 mg/dL (ref 7–25)
CO2: 30 mmol/L (ref 20–32)
Calcium: 10 mg/dL (ref 8.6–10.4)
Chloride: 102 mmol/L (ref 98–110)
Creat: 0.62 mg/dL (ref 0.50–1.03)
Globulin: 2.6 g/dL (calc) (ref 1.9–3.7)
Glucose, Bld: 121 mg/dL — ABNORMAL HIGH (ref 65–99)
Potassium: 3.9 mmol/L (ref 3.5–5.3)
Sodium: 142 mmol/L (ref 135–146)
Total Bilirubin: 0.5 mg/dL (ref 0.2–1.2)
Total Protein: 7.5 g/dL (ref 6.1–8.1)

## 2021-04-18 LAB — CBC WITH DIFFERENTIAL/PLATELET
Absolute Monocytes: 467 cells/uL (ref 200–950)
Basophils Absolute: 58 cells/uL (ref 0–200)
Basophils Relative: 0.9 %
Eosinophils Absolute: 141 cells/uL (ref 15–500)
Eosinophils Relative: 2.2 %
HCT: 41.2 % (ref 35.0–45.0)
Hemoglobin: 13.7 g/dL (ref 11.7–15.5)
Lymphs Abs: 2093 cells/uL (ref 850–3900)
MCH: 28.5 pg (ref 27.0–33.0)
MCHC: 33.3 g/dL (ref 32.0–36.0)
MCV: 85.8 fL (ref 80.0–100.0)
MPV: 10.9 fL (ref 7.5–12.5)
Monocytes Relative: 7.3 %
Neutro Abs: 3642 cells/uL (ref 1500–7800)
Neutrophils Relative %: 56.9 %
Platelets: 254 10*3/uL (ref 140–400)
RBC: 4.8 10*6/uL (ref 3.80–5.10)
RDW: 13.1 % (ref 11.0–15.0)
Total Lymphocyte: 32.7 %
WBC: 6.4 10*3/uL (ref 3.8–10.8)

## 2021-04-18 LAB — HEMOGLOBIN A1C
Hgb A1c MFr Bld: 6.3 % of total Hgb — ABNORMAL HIGH (ref <5.7)
Mean Plasma Glucose: 134 mg/dL
eAG (mmol/L): 7.4 mmol/L

## 2021-04-19 ENCOUNTER — Encounter: Payer: Self-pay | Admitting: Orthopedic Surgery

## 2021-04-20 ENCOUNTER — Other Ambulatory Visit: Payer: Self-pay

## 2021-04-20 ENCOUNTER — Ambulatory Visit: Payer: BC Managed Care – PPO | Admitting: Adult Health

## 2021-04-20 VITALS — BP 118/64 | HR 71 | Temp 98.0°F | Resp 16 | Ht 67.0 in | Wt 159.2 lb

## 2021-04-20 DIAGNOSIS — E119 Type 2 diabetes mellitus without complications: Secondary | ICD-10-CM | POA: Diagnosis not present

## 2021-04-20 DIAGNOSIS — E1169 Type 2 diabetes mellitus with other specified complication: Secondary | ICD-10-CM

## 2021-04-20 DIAGNOSIS — E785 Hyperlipidemia, unspecified: Secondary | ICD-10-CM

## 2021-04-20 DIAGNOSIS — I10 Essential (primary) hypertension: Secondary | ICD-10-CM

## 2021-04-20 DIAGNOSIS — D508 Other iron deficiency anemias: Secondary | ICD-10-CM

## 2021-04-20 NOTE — Progress Notes (Signed)
Location:  [facility]  Provider:   Durenda Age  DNP  Code Status:   Full Code  Goals of Care:  Advanced Directives 04/19/2021  Does Patient Have a Medical Advance Directive? No  Would patient like information on creating a medical advance directive? No - Patient declined  Pre-existing out of facility DNR order (yellow form or pink MOST form) -     Chief Complaint  Patient presents with   Medical Management of Chronic Issues    6 month follow up. Foot exam today. Discuss the need for eye exam, and Shringix or post pone if patient refuses.     HPI: Patient is a 56 y.o. female seen today for 6 month follow up medical management of chronic diseases.   Hyperlipidemia associated with type 2 diabetes mellitus ([place])-  takes Zetia 10 mg daily  Essential hypertension, benign -  BP 118/64, takes HCTZ 25 mg daily  Controlled type 2 diabetes mellitus without complication, without long-term current use of insulin (HCC)  -  diet-controlled  Other iron deficiency anemia -   takes Iron supplementation    Past Medical History:  Diagnosis Date   Abdominal pain, right lower quadrant    Acute frontal sinusitis    Acute upper respiratory infections of unspecified site    Allergic rhinitis, cause unspecified    Anemia, unspecified    Anxiety state, unspecified    Candidiasis of vulva and vagina    Contact dermatitis and other eczema, due to unspecified cause    Dizziness and giddiness    Dysmenorrhea    Dysuria    Dysuria    Edema    Esophageal reflux    Essential hypertension, benign    External hemorrhoids without mention of complication    TDSKAJGO(115.7)    Hyperpotassemia    Hypopotassemia    Iron deficiency anemia, unspecified    Loss of weight    Lumbago    Neoplasm of uncertain behavior of skin    Nontoxic uninodular goiter    Other abnormal blood chemistry    Other abnormal blood chemistry    Other and unspecified hyperlipidemia    Palpitations    Pure  hypercholesterolemia    Reflux esophagitis    Routine general medical examination at a health care facility    Routine gynecological examination    Shortness of breath    Sprain of tibiofibular (ligament), distal of ankle    Unspecified constipation    Unspecified essential hypertension    Unspecified pruritic disorder    Urinary tract infection, site not specified     History reviewed. No pertinent surgical history.  Allergies  Allergen Reactions   Fenofibrate Other (See Comments)    Only took for one month after she developed difficulty urinating   Crestor [Rosuvastatin] Other (See Comments)    Muscle cramps   Lipitor [Atorvastatin] Other (See Comments)    Muscle cramps   Metformin And Related Palpitations   Pravachol [Pravastatin Sodium] Other (See Comments)    Muscle cramps in legs   Other     Cranberries    Penicillins     Pt was told by father  NOT to take Penicillins.   Sulfa Antibiotics    Vioxx [Rofecoxib]     Outpatient Encounter Medications as of 04/20/2021  Medication Sig   aspirin 81 MG EC tablet Take 1 tablet (81 mg total) by mouth daily. Swallow whole.   cetirizine (ZYRTEC) 10 MG tablet Take 10 mg by mouth daily.  Cholecalciferol (VITAMIN D3) 50 MCG (2000 UT) capsule Take 1 capsule (2,000 Units total) by mouth daily.   ezetimibe (ZETIA) 10 MG tablet Take 1 tablet (10 mg total) by mouth daily.   Fe Fum-Vit C-Vit B12-FA (TRIGELS-F FORTE) 460-60-0.01-1 MG CAPS capsule Take 1 capsule by mouth daily.   fish oil-omega-3 fatty acids 1000 MG capsule Take by mouth daily.   fluticasone (FLONASE) 50 MCG/ACT nasal spray SPRAY 2 SPRAYS INTO EACH NOSTRIL EVERY DAY   hydrochlorothiazide (HYDRODIURIL) 25 MG tablet TAKE 1 TABLET (25 MG TOTAL) BY MOUTH DAILY.   polyethylene glycol powder (GLYCOLAX/MIRALAX) powder Take 17 g by mouth daily.   No facility-administered encounter medications on file as of 04/20/2021.    Review of Systems:  Review of Systems  Constitutional:   Negative for appetite change, chills, fatigue and fever.  HENT:  Negative for congestion, hearing loss, rhinorrhea and sore throat.   Eyes: Negative.   Respiratory:  Negative for cough, shortness of breath and wheezing.   Cardiovascular:  Negative for chest pain, palpitations and leg swelling.  Gastrointestinal:  Negative for abdominal pain, constipation, diarrhea, nausea and vomiting.  Genitourinary:  Negative for dysuria.  Musculoskeletal:  Negative for arthralgias, back pain and myalgias.  Skin:  Negative for color change, rash and wound.  Neurological:  Negative for dizziness, weakness and headaches.  Psychiatric/Behavioral:  Negative for behavioral problems. The patient is not nervous/anxious.    Health Maintenance  Topic Date Due   Zoster Vaccines- Shingrix (1 of 2) Never done   FOOT EXAM  02/15/2021   OPHTHALMOLOGY EXAM  03/22/2021   COVID-19 Vaccine (1) 05/06/2021 (Originally 04/18/1966)   TETANUS/TDAP  05/13/2021   HEMOGLOBIN A1C  10/15/2021   URINE MICROALBUMIN  10/20/2021   MAMMOGRAM  03/09/2023   PAP SMEAR-Modifier  03/08/2024   COLONOSCOPY (Pts 45-48yrs Insurance coverage will need to be confirmed)  08/15/2026   Hepatitis C Screening  Completed   HIV Screening  Completed   HPV VACCINES  Aged Out    Physical Exam: Vitals:   04/20/21 1001  BP: 118/64  Pulse: 71  Resp: 16  Temp: 98 F (36.7 C)  SpO2: 96%  Weight: 159 lb 3.2 oz (72.2 kg)  Height: 5\' 7"  (1.702 m)   Body mass index is 24.93 kg/m. Physical Exam Constitutional:      Appearance: Normal appearance.  HENT:     Head: Normocephalic and atraumatic.     Nose: Nose normal.     Mouth/Throat:     Mouth: Mucous membranes are moist.  Eyes:     Conjunctiva/sclera: Conjunctivae normal.  Cardiovascular:     Rate and Rhythm: Normal rate and regular rhythm.  Pulmonary:     Effort: Pulmonary effort is normal.     Breath sounds: Normal breath sounds.  Abdominal:     General: Bowel sounds are normal.      Palpations: Abdomen is soft.  Musculoskeletal:        General: Normal range of motion.     Cervical back: Normal range of motion.  Skin:    General: Skin is warm and dry.  Neurological:     General: No focal deficit present.     Mental Status: She is alert and oriented to person, place, and time.  Psychiatric:        Mood and Affect: Mood normal.        Behavior: Behavior normal.        Thought Content: Thought content normal.  Judgment: Judgment normal.    Labs reviewed: Basic Metabolic Panel: Recent Labs    10/17/20 0809 04/17/21 0800  NA 141 142  K 4.0 3.9  CL 103 102  CO2 30 30  GLUCOSE 127* 121*  BUN 15 14  CREATININE 0.62 0.62  CALCIUM 9.8 10.0   Liver Function Tests: Recent Labs    10/17/20 0809 04/17/21 0800  AST 15 15  ALT 26 25  BILITOT 0.5 0.5  PROT 7.2 7.5   No results for input(s): LIPASE, AMYLASE in the last 8760 hours. No results for input(s): AMMONIA in the last 8760 hours. CBC: Recent Labs    10/17/20 0809 04/17/21 0800  WBC 6.0 6.4  NEUTROABS 3,306 3,642  HGB 13.8 13.7  HCT 41.9 41.2  MCV 89.0 85.8  PLT 268 254   Lipid Panel: Recent Labs    10/17/20 0809 04/17/21 0800  CHOL 229* 227*  HDL 47* 49*  LDLCALC 140* 133*  TRIG 276* 315*  CHOLHDL 4.9 4.6   Lab Results  Component Value Date   HGBA1C 6.3 (H) 04/17/2021    Procedures since last visit: No results found.  Assessment/Plan  1. Hyperlipidemia associated with type 2 diabetes mellitus ([place]) Lab Results  Component Value Date   CHOL 227 (H) 04/17/2021   HDL 49 (L) 04/17/2021   LDLCALC 133 (H) 04/17/2021   TRIG 315 (H) 04/17/2021   CHOLHDL 4.6 04/17/2021   -   discussed refraining from eating fatty foods and eating more vegetables  2. Essential hypertension, benign -Blood pressure well controlled Continue current medications  3. Controlled type 2 diabetes mellitus without complication, without long-term current use of insulin (HCC) Lab Results  Component  Value Date   HGBA1C 6.3 (H) 04/17/2021   -    die-controlled -  continue to avoid sweets and take low carb diet  4. Other iron deficiency anemia Lab Results  Component Value Date   WBC 6.4 04/17/2021   HGB 13.7 04/17/2021   HCT 41.2 04/17/2021   MCV 85.8 04/17/2021   PLT 254 04/17/2021   -  discontinue Iron supplementation and repeat CBC in 3 months    Labs/tests ordered:   CBC in 3 months  Next appt:  in 3 months

## 2021-04-20 NOTE — Patient Instructions (Signed)
Cholesterol Content in Foods ?Cholesterol is a waxy, fat-like substance that helps to carry fat in the blood. The body needs cholesterol in small amounts, but too much cholesterol can cause damage to the arteries and heart. ?What foods have cholesterol? ?Cholesterol is found in animal-based foods, such as meat, seafood, and dairy. Generally, low-fat dairy and lean meats have less cholesterol than full-fat dairy and fatty meats. The milligrams of cholesterol per serving (mg per serving) of common cholesterol-containing foods are listed below. ?Meats and other proteins ?Egg -- one large whole egg has 186 mg. ?Veal shank -- 4 oz (113 g) has 141 mg. ?Lean ground turkey (93% lean) -- 4 oz (113 g) has 118 mg. ?Fat-trimmed lamb loin -- 4 oz (113 g) has 106 mg. ?Lean ground beef (90% lean) -- 4 oz (113 g) has 100 mg. ?Lobster -- 3.5 oz (99 g) has 90 mg. ?Pork loin chops -- 4 oz (113 g) has 86 mg. ?Canned salmon -- 3.5 oz (99 g) has 83 mg. ?Fat-trimmed beef top loin -- 4 oz (113 g) has 78 mg. ?Frankfurter -- 1 frank (3.5 oz or 99 g) has 77 mg. ?Crab -- 3.5 oz (99 g) has 71 mg. ?Roasted chicken without skin, white meat -- 4 oz (113 g) has 66 mg. ?Light bologna -- 2 oz (57 g) has 45 mg. ?Deli-cut turkey -- 2 oz (57 g) has 31 mg. ?Canned tuna -- 3.5 oz (99 g) has 31 mg. ?Bacon -- 1 oz (28 g) has 29 mg. ?Oysters and mussels (raw) -- 3.5 oz (99 g) has 25 mg. ?Mackerel -- 1 oz (28 g) has 22 mg. ?Trout -- 1 oz (28 g) has 20 mg. ?Pork sausage -- 1 link (1 oz or 28 g) has 17 mg. ?Salmon -- 1 oz (28 g) has 16 mg. ?Tilapia -- 1 oz (28 g) has 14 mg. ?Dairy ?Soft-serve ice cream -- ? cup (4 oz or 86 g) has 103 mg. ?Whole-milk yogurt -- 1 cup (8 oz or 245 g) has 29 mg. ?Cheddar cheese -- 1 oz (28 g) has 28 mg. ?American cheese -- 1 oz (28 g) has 28 mg. ?Whole milk -- 1 cup (8 oz or 250 mL) has 23 mg. ?2% milk -- 1 cup (8 oz or 250 mL) has 18 mg. ?Cream cheese -- 1 tablespoon (Tbsp) (14.5 g) has 15 mg. ?Cottage cheese -- ? cup (4 oz or 113  g) has 14 mg. ?Low-fat (1%) milk -- 1 cup (8 oz or 250 mL) has 10 mg. ?Sour cream -- 1 Tbsp (12 g) has 8.5 mg. ?Low-fat yogurt -- 1 cup (8 oz or 245 g) has 8 mg. ?Nonfat Greek yogurt -- 1 cup (8 oz or 228 g) has 7 mg. ?Half-and-half cream -- 1 Tbsp (15 mL) has 5 mg. ?Fats and oils ?Cod liver oil -- 1 tablespoon (Tbsp) (13.6 g) has 82 mg. ?Butter -- 1 Tbsp (14 g) has 15 mg. ?Lard -- 1 Tbsp (12.8 g) has 14 mg. ?Bacon grease -- 1 Tbsp (12.9 g) has 14 mg. ?Mayonnaise -- 1 Tbsp (13.8 g) has 5-10 mg. ?Margarine -- 1 Tbsp (14 g) has 3-10 mg. ?The items listed above may not be a complete list of foods with cholesterol. Exact amounts of cholesterol in these foods may vary depending on specific ingredients and brands. Contact a dietitian for more information. ?What foods do not have cholesterol? ?Most plant-based foods do not have cholesterol unless you combine them with a food that has cholesterol.   Foods without cholesterol include: ?Grains and cereals. ?Vegetables. ?Fruits. ?Vegetable oils, such as olive, canola, and sunflower oil. ?Legumes, such as peas, beans, and lentils. ?Nuts and seeds. ?Egg whites. ?The items listed above may not be a complete list of foods that do not have cholesterol. Contact a dietitian for more information. ?Summary ?The body needs cholesterol in small amounts, but too much cholesterol can cause damage to the arteries and heart. ?Cholesterol is found in animal-based foods, such as meat, seafood, and dairy. Generally, low-fat dairy and lean meats have less cholesterol than full-fat dairy and fatty meats. ?This information is not intended to replace advice given to you by your health care provider. Make sure you discuss any questions you have with your health care provider. ?Document Revised: 06/17/2020 Document Reviewed: 06/17/2020 ?Elsevier Patient Education ? 2022 Else[employer] Inc[employer] ? ?

## 2021-06-28 DIAGNOSIS — Z13820 Encounter for screening for osteoporosis: Secondary | ICD-10-CM | POA: Diagnosis not present

## 2021-07-27 ENCOUNTER — Ambulatory Visit: Payer: BC Managed Care – PPO | Admitting: Orthopedic Surgery

## 2021-07-29 ENCOUNTER — Other Ambulatory Visit: Payer: Self-pay | Admitting: Orthopedic Surgery

## 2021-08-23 ENCOUNTER — Encounter: Payer: Self-pay | Admitting: Orthopedic Surgery

## 2021-08-24 ENCOUNTER — Encounter: Payer: Self-pay | Admitting: Orthopedic Surgery

## 2021-08-24 ENCOUNTER — Ambulatory Visit: Payer: BC Managed Care – PPO | Admitting: Orthopedic Surgery

## 2021-08-24 VITALS — BP 118/74 | HR 61 | Temp 97.2°F | Resp 18 | Ht 67.0 in | Wt 165.0 lb

## 2021-08-24 DIAGNOSIS — E119 Type 2 diabetes mellitus without complications: Secondary | ICD-10-CM

## 2021-08-24 DIAGNOSIS — E785 Hyperlipidemia, unspecified: Secondary | ICD-10-CM | POA: Diagnosis not present

## 2021-08-24 DIAGNOSIS — D508 Other iron deficiency anemias: Secondary | ICD-10-CM

## 2021-08-24 DIAGNOSIS — I10 Essential (primary) hypertension: Secondary | ICD-10-CM | POA: Diagnosis not present

## 2021-08-24 DIAGNOSIS — E781 Pure hyperglyceridemia: Secondary | ICD-10-CM

## 2021-08-24 DIAGNOSIS — E1169 Type 2 diabetes mellitus with other specified complication: Secondary | ICD-10-CM | POA: Diagnosis not present

## 2021-08-24 DIAGNOSIS — E663 Overweight: Secondary | ICD-10-CM

## 2021-08-24 NOTE — Progress Notes (Signed)
Careteam: Patient Care Team: Joyce Alanis, NP as PCP - General (Adult Health Nurse Practitioner) Thalia Bloodgood, OD as Referring Physician (Optometry)  Seen by: Windell Moulding, AGNP-C  PLACE OF SERVICE:  [place] Directive information Does Patient Have a Medical Advance Directive?: No, Would patient like information on creating a medical advance directive?: No - Patient declined  Allergies  Allergen Reactions   Fenofibrate Other (See Comments)    Only took for one month after she developed difficulty urinating   Crestor [Rosuvastatin] Other (See Comments)    Muscle cramps   Lipitor [Atorvastatin] Other (See Comments)    Muscle cramps   Metformin And Related Palpitations   Pravachol [Pravastatin Sodium] Other (See Comments)    Muscle cramps in legs   Other     Cranberries    Penicillins     Pt was told by father  NOT to take Penicillins.   Sulfa Antibiotics    Vioxx [Rofecoxib]     Chief Complaint  Patient presents with   Medical Management of Chronic Issues    PT is here for a 73M F/U for DM II, PT also needs Microalbumin lab and eye exam. PT optometry is Dr. Thalia Bloodgood     HPI: Patient is a 56 y.o. female seen today for medical management of chronic conditions.   Fasting for lab work today.   No health concerns today.   T2DM- last eye exam 03/27/2021, foot exam normal today, not on medication at this time, admits to limiting carbs and sugars in diet  HTN- does not take blood pressures at home, remains on HCTZ  HLD- LDL 133 02/27, remains on Zetia  Hypertriglyceridemia- Triglycerides 315 04/17/2021, plan to recheck today  Mammogram- reports having mammogram at Dr. Matthew Saras office 03/08/2021    Review of Systems:  Review of Systems  Constitutional:  Negative for chills, fever, malaise/fatigue and weight loss.  HENT:  Negative for congestion, hearing loss and sore throat.   Eyes:  Negative for blurred vision and double vision.  Respiratory:   Negative for cough, shortness of breath and wheezing.   Cardiovascular:  Negative for chest pain and leg swelling.  Gastrointestinal:  Negative for abdominal pain, blood in stool, constipation, diarrhea, heartburn, nausea and vomiting.  Genitourinary:  Negative for dysuria and frequency.  Musculoskeletal:  Negative for falls and joint pain.  Skin:  Negative for rash.  Neurological:  Negative for dizziness, weakness and headaches.  Psychiatric/Behavioral:  Negative for depression. The patient is not nervous/anxious and does not have insomnia.     Past Medical History:  Diagnosis Date   Abdominal pain, right lower quadrant    Acute frontal sinusitis    Acute upper respiratory infections of unspecified site    Allergic rhinitis, cause unspecified    Anemia, unspecified    Anxiety state, unspecified    Candidiasis of vulva and vagina    Contact dermatitis and other eczema, due to unspecified cause    Dizziness and giddiness    Dysmenorrhea    Dysuria    Dysuria    Edema    Esophageal reflux    Essential hypertension, benign    External hemorrhoids without mention of complication    KCLEXNTZ(001.7)    Hyperpotassemia    Hypopotassemia    Iron deficiency anemia, unspecified    Loss of weight    Lumbago    Neoplasm of uncertain behavior of skin    Nontoxic uninodular goiter    Other abnormal  blood chemistry    Other abnormal blood chemistry    Other and unspecified hyperlipidemia    Palpitations    Pure hypercholesterolemia    Reflux esophagitis    Routine general medical examination at a health care facility    Routine gynecological examination    Shortness of breath    Sprain of tibiofibular (ligament), distal of ankle    Unspecified constipation    Unspecified essential hypertension    Unspecified pruritic disorder    Urinary tract infection, site not specified    History reviewed. No pertinent surgical history. Social History:   reports that she has never smoked. She  has never used smokeless tobacco. She reports that she does not drink alcohol and does not use drugs.  History reviewed. No pertinent family history.  Medications: Patient's Medications  New Prescriptions   No medications on file  Previous Medications   ASPIRIN 81 MG EC TABLET    Take 1 tablet (81 mg total) by mouth daily. Swallow whole.   CETIRIZINE (ZYRTEC) 10 MG TABLET    Take 10 mg by mouth daily.   CHOLECALCIFEROL (VITAMIN D3) 50 MCG (2000 UT) CAPSULE    Take 1 capsule (2,000 Units total) by mouth daily.   EZETIMIBE (ZETIA) 10 MG TABLET    Take 1 tablet (10 mg total) by mouth daily.   FISH OIL-OMEGA-3 FATTY ACIDS 1000 MG CAPSULE    Take by mouth daily.   FLUTICASONE (FLONASE) 50 MCG/ACT NASAL SPRAY    SPRAY 2 SPRAYS INTO EACH NOSTRIL EVERY DAY   HYDROCHLOROTHIAZIDE (HYDRODIURIL) 25 MG TABLET    TAKE 1 TABLET (25 MG TOTAL) BY MOUTH DAILY.   POLYETHYLENE GLYCOL POWDER (GLYCOLAX/MIRALAX) POWDER    Take 17 g by mouth daily.  Modified Medications   No medications on file  Discontinued Medications   No medications on file    Physical Exam:  There were no vitals filed for this visit. There is no height or weight on file to calculate BMI. Wt Readings from Last 3 Encounters:  04/20/21 159 lb 3.2 oz (72.2 kg)  10/20/20 162 lb 9.6 oz (73.8 kg)  04/25/20 167 lb 12.8 oz (76.1 kg)    Physical Exam Vitals reviewed.  Constitutional:      General: She is not in acute distress. HENT:     Head: Normocephalic.  Eyes:     General:        Right eye: No discharge.        Left eye: No discharge.  Neck:     Thyroid: No thyroid mass or thyromegaly.  Cardiovascular:     Rate and Rhythm: Normal rate and regular rhythm.     Pulses:          Dorsalis pedis pulses are 1+ on the right side and 1+ on the left side.     Heart sounds: Normal heart sounds.  Pulmonary:     Effort: Pulmonary effort is normal. No respiratory distress.     Breath sounds: Normal breath sounds. No wheezing.   Abdominal:     General: Bowel sounds are normal. There is no distension.     Palpations: Abdomen is soft.     Tenderness: There is no abdominal tenderness.  Musculoskeletal:     Cervical back: Neck supple.     Right lower leg: No edema.     Left lower leg: No edema.  Feet:     Right foot:     Protective Sensation: 10 sites tested.  Skin integrity: Skin integrity normal.     Toenail Condition: Right toenails are normal.     Left foot:     Protective Sensation: 10 sites tested.  10 sites sensed.     Skin integrity: Skin integrity normal.     Toenail Condition: Left toenails are normal.  Skin:    General: Skin is warm and dry.     Capillary Refill: Capillary refill takes less than 2 seconds.  Neurological:     General: No focal deficit present.     Mental Status: She is alert and oriented to person, place, and time.  Psychiatric:        Mood and Affect: Mood normal.        Behavior: Behavior normal.     Labs reviewed: Basic Metabolic Panel: Recent Labs    10/17/20 0809 04/17/21 0800  NA 141 142  K 4.0 3.9  CL 103 102  CO2 30 30  GLUCOSE 127* 121*  BUN 15 14  CREATININE 0.62 0.62  CALCIUM 9.8 10.0   Liver Function Tests: Recent Labs    10/17/20 0809 04/17/21 0800  AST 15 15  ALT 26 25  BILITOT 0.5 0.5  PROT 7.2 7.5   No results for input(s): "LIPASE", "AMYLASE" in the last 8760 hours. No results for input(s): "AMMONIA" in the last 8760 hours. CBC: Recent Labs    10/17/20 0809 04/17/21 0800  WBC 6.0 6.4  NEUTROABS 3,306 3,642  HGB 13.8 13.7  HCT 41.9 41.2  MCV 89.0 85.8  PLT 268 254   Lipid Panel: Recent Labs    10/17/20 0809 04/17/21 0800  CHOL 229* 227*  HDL 47* 49*  LDLCALC 140* 133*  TRIG 276* 315*  CHOLHDL 4.9 4.6   TSH: No results for input(s): "TSH" in the last 8760 hours. A1C: Lab Results  Component Value Date   HGBA1C 6.3 (H) 04/17/2021     Assessment/Plan 1. Controlled type 2 diabetes mellitus without complication,  without long-term current use of insulin (HCC) - A1c 6.3 04/17/2021 - diet controlled - cont HCTZ for kidney protection - diabetic eye exam 03/27/2021 - foot exam done today - Hemoglobin A1c - CMP - Microalbumin/Creatinine Ratio, Urine  2. Essential hypertension, benign - controlled - cont HCTZ  3. Hyperlipidemia associated with type 2 diabetes mellitus (HCC) - LDL 133 04/17/2021 - discussed diet low in fat and fried foods - cont Zetia - Lipid Panel  4. Other iron deficiency anemia - hgb 13.7, MCV 85.8 04/17/2021 - CBC with Differential/Platelet  5. Overweight with body mass index (BMI) 25.0-29.9 - BMI 25.84 - weight up 6 lbs since last encounter - discussed limiting calories < 1500/day  6. Hypertriglyceridemia - triglycerides 315 (02/27), was 276 (08/29) - lipid panel - may consider fenofibrate if still elevated  Total time: 31 minutes. Greater than 50% of total time spent doing patient education regarding health maintenance, T2DM, low cholesterol diet, and medication management.     Next appt: 12/28/2021  Windell Moulding, [facility] [phone]

## 2021-08-24 NOTE — Patient Instructions (Addendum)
Please call your eye doctor and get last eye exam faxed to our office- [phone]  Please get your tetanus vaccine at local pharmacy   Ask about Shingles vaccine- its called Shingrix

## 2021-08-25 ENCOUNTER — Other Ambulatory Visit: Payer: Self-pay | Admitting: Orthopedic Surgery

## 2021-08-25 LAB — COMPREHENSIVE METABOLIC PANEL
AG Ratio: 2 (calc) (ref 1.0–2.5)
ALT: 29 U/L (ref 6–29)
AST: 18 U/L (ref 10–35)
Albumin: 4.8 g/dL (ref 3.6–5.1)
Alkaline phosphatase (APISO): 40 U/L (ref 37–153)
BUN: 18 mg/dL (ref 7–25)
CO2: 27 mmol/L (ref 20–32)
Calcium: 9.8 mg/dL (ref 8.6–10.4)
Chloride: 103 mmol/L (ref 98–110)
Creat: 0.67 mg/dL (ref 0.50–1.03)
Globulin: 2.4 g/dL (calc) (ref 1.9–3.7)
Glucose, Bld: 115 mg/dL — ABNORMAL HIGH (ref 65–99)
Potassium: 3.9 mmol/L (ref 3.5–5.3)
Sodium: 140 mmol/L (ref 135–146)
Total Bilirubin: 0.5 mg/dL (ref 0.2–1.2)
Total Protein: 7.2 g/dL (ref 6.1–8.1)

## 2021-08-25 LAB — CBC WITH DIFFERENTIAL/PLATELET
Absolute Monocytes: 402 cells/uL (ref 200–950)
Basophils Absolute: 72 cells/uL (ref 0–200)
Basophils Relative: 1.3 %
Eosinophils Absolute: 110 cells/uL (ref 15–500)
Eosinophils Relative: 2 %
HCT: 41.2 % (ref 35.0–45.0)
Hemoglobin: 14.2 g/dL (ref 11.7–15.5)
Lymphs Abs: 2173 cells/uL (ref 850–3900)
MCH: 29 pg (ref 27.0–33.0)
MCHC: 34.5 g/dL (ref 32.0–36.0)
MCV: 84.1 fL (ref 80.0–100.0)
MPV: 11.2 fL (ref 7.5–12.5)
Monocytes Relative: 7.3 %
Neutro Abs: 2745 cells/uL (ref 1500–7800)
Neutrophils Relative %: 49.9 %
Platelets: 262 10*3/uL (ref 140–400)
RBC: 4.9 10*6/uL (ref 3.80–5.10)
RDW: 12.8 % (ref 11.0–15.0)
Total Lymphocyte: 39.5 %
WBC: 5.5 10*3/uL (ref 3.8–10.8)

## 2021-08-25 LAB — LIPID PANEL
Cholesterol: 242 mg/dL — ABNORMAL HIGH (ref <200)
HDL: 50 mg/dL (ref >=50)
LDL Cholesterol (Calc): 144 mg/dL (calc) — ABNORMAL HIGH
Non-HDL Cholesterol (Calc): 192 mg/dL (calc) — ABNORMAL HIGH (ref <130)
Total CHOL/HDL Ratio: 4.8 (calc) (ref <5.0)
Triglycerides: 292 mg/dL — ABNORMAL HIGH (ref <150)

## 2021-08-25 LAB — MICROALBUMIN / CREATININE URINE RATIO
Creatinine, Urine: 109 mg/dL (ref 20–275)
Microalb Creat Ratio: 6 mcg/mg creat (ref <30)
Microalb, Ur: 0.6 mg/dL

## 2021-08-25 LAB — HEMOGLOBIN A1C
Hgb A1c MFr Bld: 6.3 % of total Hgb — ABNORMAL HIGH (ref <5.7)
Mean Plasma Glucose: 134 mg/dL
eAG (mmol/L): 7.4 mmol/L

## 2021-09-25 ENCOUNTER — Other Ambulatory Visit: Payer: Self-pay | Admitting: Orthopedic Surgery

## 2021-12-28 ENCOUNTER — Ambulatory Visit: Payer: BC Managed Care – PPO | Admitting: Orthopedic Surgery

## 2021-12-28 DIAGNOSIS — L821 Other seborrheic keratosis: Secondary | ICD-10-CM | POA: Diagnosis not present

## 2021-12-28 DIAGNOSIS — D225 Melanocytic nevi of trunk: Secondary | ICD-10-CM | POA: Diagnosis not present

## 2021-12-28 DIAGNOSIS — L814 Other melanin hyperpigmentation: Secondary | ICD-10-CM | POA: Diagnosis not present

## 2022-01-20 ENCOUNTER — Other Ambulatory Visit: Payer: Self-pay | Admitting: Orthopedic Surgery

## 2022-01-25 ENCOUNTER — Encounter: Payer: Self-pay | Admitting: Orthopedic Surgery

## 2022-01-25 ENCOUNTER — Ambulatory Visit: Payer: BC Managed Care – PPO | Admitting: Orthopedic Surgery

## 2022-01-25 VITALS — BP 120/80 | HR 60 | Temp 96.2°F | Resp 18 | Ht 67.0 in | Wt 166.5 lb

## 2022-01-25 DIAGNOSIS — I10 Essential (primary) hypertension: Secondary | ICD-10-CM | POA: Diagnosis not present

## 2022-01-25 DIAGNOSIS — E781 Pure hyperglyceridemia: Secondary | ICD-10-CM

## 2022-01-25 DIAGNOSIS — E1169 Type 2 diabetes mellitus with other specified complication: Secondary | ICD-10-CM | POA: Diagnosis not present

## 2022-01-25 DIAGNOSIS — E119 Type 2 diabetes mellitus without complications: Secondary | ICD-10-CM | POA: Diagnosis not present

## 2022-01-25 DIAGNOSIS — E785 Hyperlipidemia, unspecified: Secondary | ICD-10-CM

## 2022-01-25 DIAGNOSIS — Z1231 Encounter for screening mammogram for malignant neoplasm of breast: Secondary | ICD-10-CM

## 2022-01-25 NOTE — Patient Instructions (Addendum)
Please get flu vaccine  Please consider getting Shingles (Shringrix) and tetanus vaccine a local pharmacy.

## 2022-01-25 NOTE — Progress Notes (Signed)
Careteam: Patient Care Team: Yvonna Alanis, NP as PCP - General (Adult Health Nurse Practitioner) Thalia Bloodgood, OD as Referring Physician (Optometry)  Seen by: Windell Moulding, AGNP-C  PLACE OF SERVICE:  [place]  Advanced Directive information    Allergies  Allergen Reactions   Fenofibrate Other (See Comments)    Only took for one month after she developed difficulty urinating   Crestor [Rosuvastatin] Other (See Comments)    Muscle cramps   Lipitor [Atorvastatin] Other (See Comments)    Muscle cramps   Metformin And Related Palpitations   Pravachol [Pravastatin Sodium] Other (See Comments)    Muscle cramps in legs   Other     Cranberries    Penicillins     Pt was told by father  NOT to take Penicillins.   Sulfa Antibiotics    Vioxx [Rofecoxib]     Chief Complaint  Patient presents with   Medical Management of Chronic Issues    Office Visit for a 4 month follow up and fasting labs   Quality Metric Gaps    Needs to Discuss Flu shot , Shingrix & TDAP Vaccine. [facility] Immunization was unable to verified.        HPI: Patient is a 56 y.o. female seen today for medical management of chronic conditions.   No health concerns. No recent hospitalizations, accidents, injuries.   She is fasting for labs.   Refusing to have flu vaccine today. Would like to have at [facility].   Colonoscopy 2018, repeat in 10 years   Mammogram 2021. Orders placed.    Review of Systems:  Review of Systems  Constitutional:  Negative for fever and malaise/fatigue.  HENT:  Negative for congestion.   Eyes:  Negative for blurred vision and double vision.  Respiratory:  Negative for cough, shortness of breath and wheezing.   Cardiovascular:  Negative for chest pain and leg swelling.  Gastrointestinal:  Positive for heartburn. Negative for abdominal pain, blood in stool, constipation, diarrhea, nausea and vomiting.  Genitourinary:  Negative for dysuria.  Musculoskeletal:  Negative for falls and  joint pain.  Neurological:  Negative for dizziness, weakness and headaches.  Endo/Heme/Allergies:  Positive for environmental allergies.  Psychiatric/Behavioral:  Negative for depression and memory loss. The patient is not nervous/anxious and does not have insomnia.     Past Medical History:  Diagnosis Date   Abdominal pain, right lower quadrant    Acute frontal sinusitis    Acute upper respiratory infections of unspecified site    Allergic rhinitis, cause unspecified    Anemia, unspecified    Anxiety state, unspecified    Candidiasis of vulva and vagina    Contact dermatitis and other eczema, due to unspecified cause    Dizziness and giddiness    Dysmenorrhea    Dysuria    Dysuria    Edema    Esophageal reflux    Essential hypertension, benign    External hemorrhoids without mention of complication    JHERDEYC(144.8)    Hyperpotassemia    Hypopotassemia    Iron deficiency anemia, unspecified    Loss of weight    Lumbago    Neoplasm of uncertain behavior of skin    Nontoxic uninodular goiter    Other abnormal blood chemistry    Other abnormal blood chemistry    Other and unspecified hyperlipidemia    Palpitations    Pure hypercholesterolemia    Reflux esophagitis    Routine general medical examination at a health care facility  Routine gynecological examination    Shortness of breath    Sprain of tibiofibular (ligament), distal of ankle    Unspecified constipation    Unspecified essential hypertension    Unspecified pruritic disorder    Urinary tract infection, site not specified    No past surgical history on file. Social History:   reports that she has never smoked. She has never used smokeless tobacco. She reports that she does not drink alcohol and does not use drugs.  No family history on file.  Medications: Patient's Medications  New Prescriptions   No medications on file  Previous Medications   ASPIRIN 81 MG EC TABLET    Take 1 tablet (81 mg total)  by mouth daily. Swallow whole.   CETIRIZINE (ZYRTEC) 10 MG TABLET    Take 10 mg by mouth daily.   CHOLECALCIFEROL (VITAMIN D3) 50 MCG (2000 UT) CAPSULE    Take 1 capsule (2,000 Units total) by mouth daily.   EZETIMIBE (ZETIA) 10 MG TABLET    Take 1 tablet (10 mg total) by mouth daily.   FISH OIL-OMEGA-3 FATTY ACIDS 1000 MG CAPSULE    Take by mouth daily.   FLUTICASONE (FLONASE) 50 MCG/ACT NASAL SPRAY    SPRAY 2 SPRAYS INTO EACH NOSTRIL EVERY DAY   HYDROCHLOROTHIAZIDE (HYDRODIURIL) 25 MG TABLET    TAKE 1 TABLET (25 MG TOTAL) BY MOUTH DAILY.   POLYETHYLENE GLYCOL POWDER (GLYCOLAX/MIRALAX) POWDER    Take 17 g by mouth daily.  Modified Medications   No medications on file  Discontinued Medications   No medications on file    Physical Exam:  Vitals:   01/25/22 0806  Height: _0  (1.702 m)   Body mass index is 25.84 kg/m. Wt Readings from Last 3 Encounters:  08/24/21 165 lb (74.8 kg)  04/20/21 159 lb 3.2 oz (72.2 kg)  10/20/20 162 lb 9.6 oz (73.8 kg)    Physical Exam Vitals reviewed.  Constitutional:      General: She is not in acute distress. HENT:     Head: Normocephalic.     Right Ear: There is no impacted cerumen.     Left Ear: There is no impacted cerumen.     Nose: Nose normal.     Mouth/Throat:     Mouth: Mucous membranes are moist.  Eyes:     General:        Right eye: No discharge.        Left eye: No discharge.  Neck:     Thyroid: No thyroid mass or thyromegaly.     Vascular: No carotid bruit.  Cardiovascular:     Rate and Rhythm: Normal rate and regular rhythm.     Pulses: Normal pulses.     Heart sounds: Normal heart sounds.  Pulmonary:     Effort: Pulmonary effort is normal. No respiratory distress.     Breath sounds: Normal breath sounds. No wheezing.  Abdominal:     General: Bowel sounds are normal. There is no distension.     Palpations: Abdomen is soft.     Tenderness: There is no abdominal tenderness.  Musculoskeletal:     Cervical back: Neck  supple.     Right lower leg: No edema.     Left lower leg: No edema.  Lymphadenopathy:     Cervical: No cervical adenopathy.  Skin:    General: Skin is warm and dry.     Capillary Refill: Capillary refill takes less than 2 seconds.  Neurological:  General: No focal deficit present.     Mental Status: She is alert and oriented to person, place, and time.  Psychiatric:        Mood and Affect: Mood normal.        Behavior: Behavior normal.     Labs reviewed: Basic Metabolic Panel: Recent Labs    04/17/21 0800 08/24/21 0930  NA 142 140  K 3.9 3.9  CL 102 103  CO2 30 27  GLUCOSE 121* 115*  BUN 14 18  CREATININE 0.62 0.67  CALCIUM 10.0 9.8   Liver Function Tests: Recent Labs    04/17/21 0800 08/24/21 0930  AST 15 18  ALT 25 29  BILITOT 0.5 0.5  PROT 7.5 7.2   No results for input(s): "LIPASE", "AMYLASE" in the last 8760 hours. No results for input(s): "AMMONIA" in the last 8760 hours. CBC: Recent Labs    04/17/21 0800 08/24/21 0930  WBC 6.4 5.5  NEUTROABS 3,642 2,745  HGB 13.7 14.2  HCT 41.2 41.2  MCV 85.8 84.1  PLT 254 262   Lipid Panel: Recent Labs    04/17/21 0800 08/24/21 0930  CHOL 227* 242*  HDL 49* 50  LDLCALC 133* 144*  TRIG 315* 292*  CHOLHDL 4.6 4.8   TSH: No results for input(s): "TSH" in the last 8760 hours. A1C: Lab Results  Component Value Date   HGBA1C 6.3 (H) 08/24/2021     Assessment/Plan 1. Controlled type 2 diabetes mellitus without complication, without long-term current use of insulin (HCC) - A1c 6.3 08/24/2021 - cont asa, HCTZ and Zetia - diet controlled - Hemoglobin A1c - CMP with eGFR([facility])  2. Essential hypertension, benign - controlled - BUN/creat 18/0.67 08/24/2021 - cont HCTZ - CMP with eGFR([facility])  3. Hyperlipidemia associated with type 2 diabetes mellitus (HCC) - Total 242, LDL 144 08/24/2021 - unable to tolerate statins - cont Zetia - cont diet low in fat and fried foods - Lipid Panel  4.  Hypertriglyceridemia - Triglycerides 292 (07/06)> was 315 (02/27) - Lipid Panel  5. Encounter for screening mammogram for malignant neoplasm of breast - MM Digital Screening  Total time: 28 minutes. Greater than 50% of total time spent doing patient education regarding health maintenance, HTN, elevated cholesterol/triglycerides including weight loss and diet.    Next appt: Visit date not found  [place], [facility] [phone]

## 2022-01-26 ENCOUNTER — Other Ambulatory Visit: Payer: Self-pay | Admitting: Orthopedic Surgery

## 2022-01-26 DIAGNOSIS — E119 Type 2 diabetes mellitus without complications: Secondary | ICD-10-CM

## 2022-01-26 LAB — COMPLETE METABOLIC PANEL WITH GFR
AG Ratio: 1.9 (calc) (ref 1.0–2.5)
ALT: 36 U/L — ABNORMAL HIGH (ref 6–29)
AST: 21 U/L (ref 10–35)
Albumin: 5.1 g/dL (ref 3.6–5.1)
Alkaline phosphatase (APISO): 47 U/L (ref 37–153)
BUN: 14 mg/dL (ref 7–25)
CO2: 29 mmol/L (ref 20–32)
Calcium: 10.1 mg/dL (ref 8.6–10.4)
Chloride: 101 mmol/L (ref 98–110)
Creat: 0.69 mg/dL (ref 0.50–1.03)
Globulin: 2.7 g/dL (calc) (ref 1.9–3.7)
Glucose, Bld: 126 mg/dL — ABNORMAL HIGH (ref 65–99)
Potassium: 4 mmol/L (ref 3.5–5.3)
Sodium: 141 mmol/L (ref 135–146)
Total Bilirubin: 0.4 mg/dL (ref 0.2–1.2)
Total Protein: 7.8 g/dL (ref 6.1–8.1)
eGFR: 101 mL/min/{1.73_m2} (ref >=60)

## 2022-01-26 LAB — LIPID PANEL
Cholesterol: 241 mg/dL — ABNORMAL HIGH (ref <200)
HDL: 54 mg/dL (ref >=50)
LDL Cholesterol (Calc): 151 mg/dL (calc) — ABNORMAL HIGH
Non-HDL Cholesterol (Calc): 187 mg/dL (calc) — ABNORMAL HIGH (ref <130)
Total CHOL/HDL Ratio: 4.5 (calc) (ref <5.0)
Triglycerides: 222 mg/dL — ABNORMAL HIGH (ref <150)

## 2022-01-26 LAB — HEMOGLOBIN A1C
Hgb A1c MFr Bld: 7.2 % of total Hgb — ABNORMAL HIGH (ref <5.7)
Mean Plasma Glucose: 160 mg/dL
eAG (mmol/L): 8.9 mmol/L

## 2022-02-15 IMAGING — US US BREAST*L* LIMITED INC AXILLA
1 series · 7 of 7 positions shown · non-contrast
Comparison: Previous exam(s).

CLINICAL DATA: Recall from screening mammography with
tomosynthesis, possible mass involving the LOWER LEFT breast at
ANTERIOR depth visible only on the MLO images.

EXAM:
DIGITAL DIAGNOSTIC LEFT MAMMOGRAM WITH CAD AND TOMO
ULTRASOUND LEFT BREAST

[Series 1: us breast*left* limited inc axilla · 0.06mm/px · 7 of 7 slices shown]
[im 1/7]
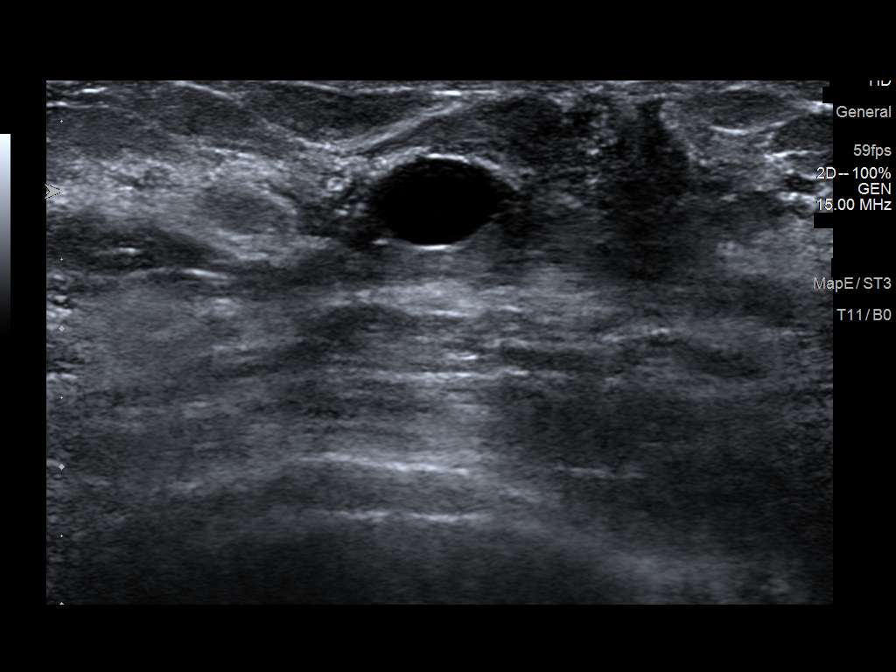
[im 2/7]
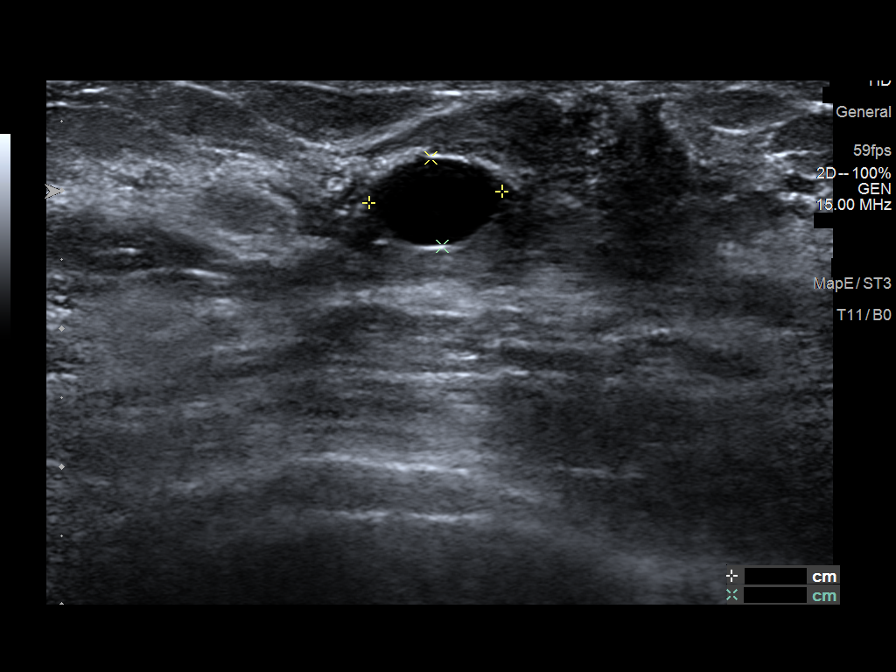
[im 3/7]
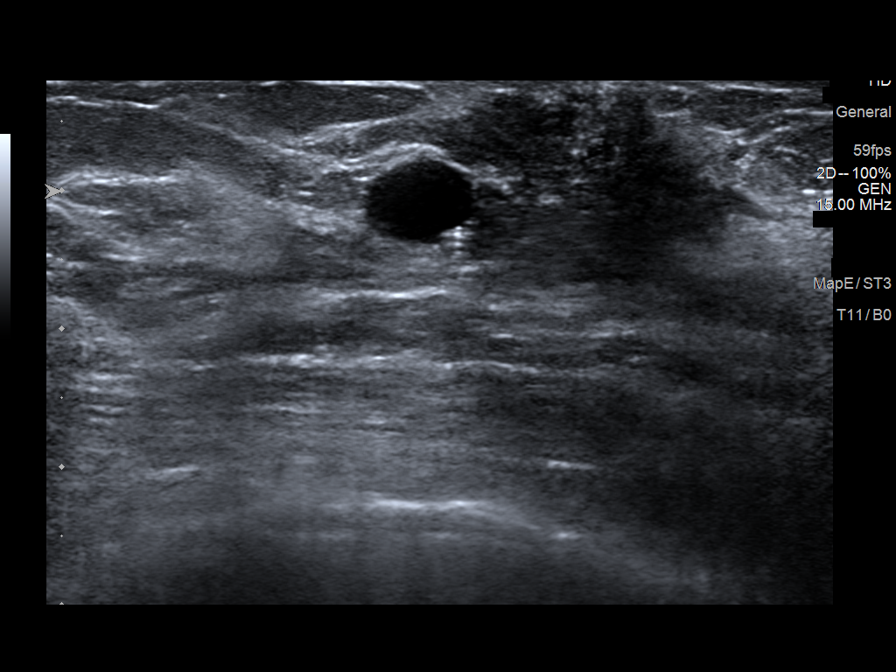
[im 4/7]
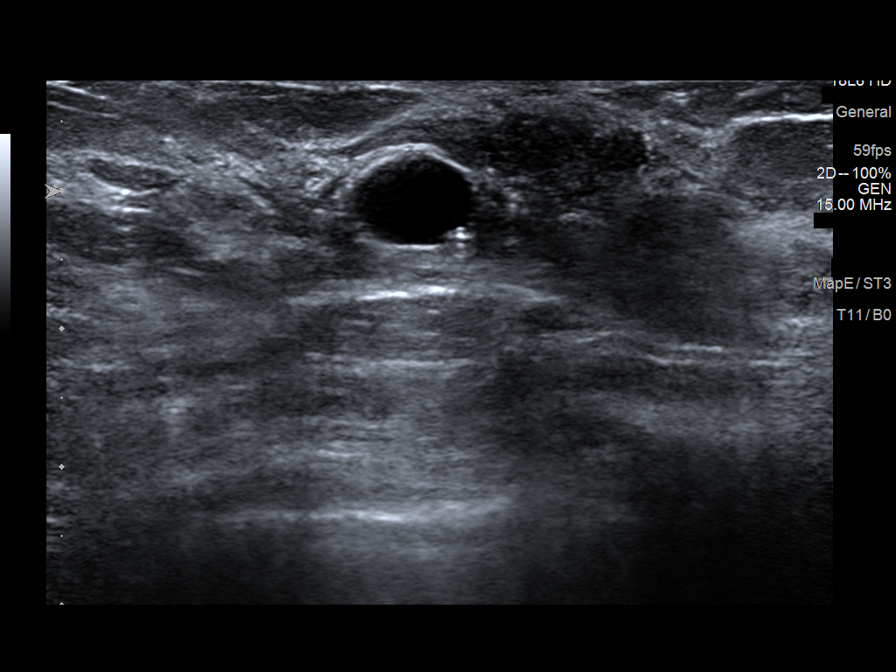
[im 5/7]
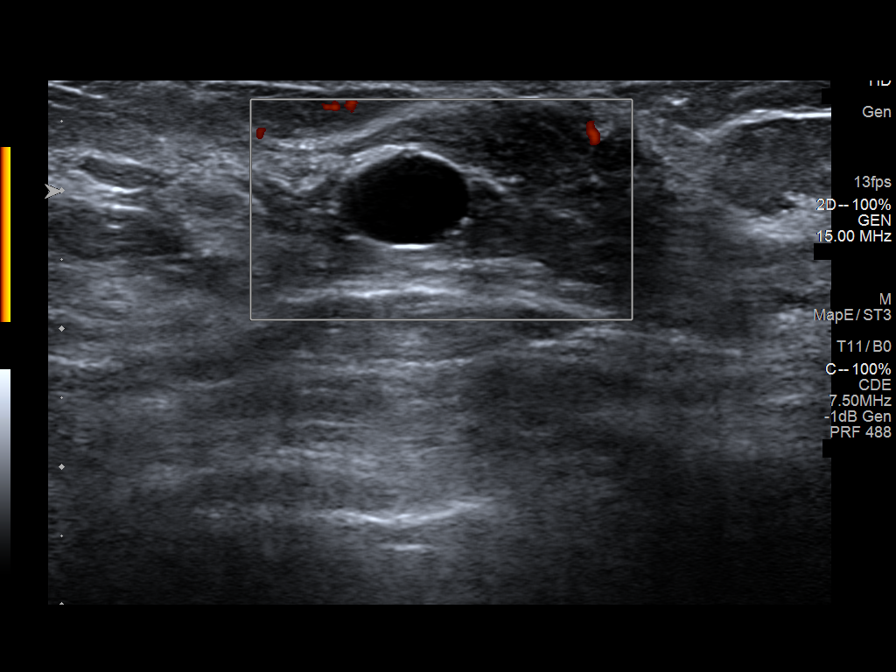
[im 6/7]
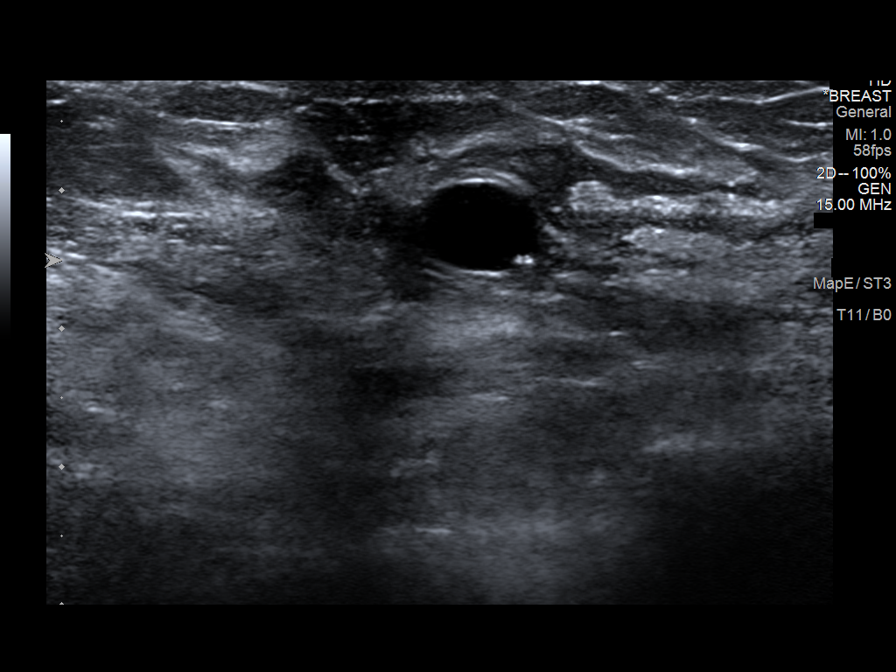
[im 7/7]
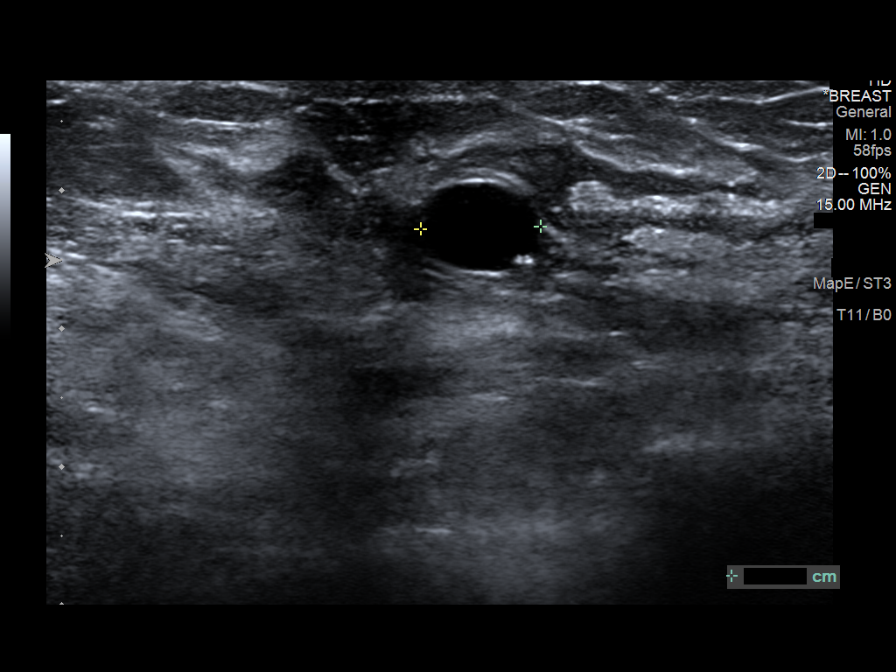

[7 of 7 positions shown; findings below may reference images not displayed]

ACR Breast Density Category d: The breast tissue is extremely dense,
which lowers the sensitivity of mammography.
FINDINGS: Tomosynthesis and synthesized spot compression MLO view of the area
of concern in the LEFT breast and a tomosynthesis and synthesized
full field mediolateral view of the LEFT breast were obtained. The
full field mediolateral image was processed with CAD.

The asymmetry in the LOWER subareolar location questioned on
screening mammography persists though partially disperses upon
compression. On the full field mediolateral images, it has the
appearance of an approximate 1 cm isodense mass without associated
architectural distortion or suspicious calcifications, though there
is a solitary calcification dependently in the mass.

No suspicious findings elsewhere in the LEFT breast on the full
field mediolateral images.

Targeted ultrasound is performed, showing a circumscribed anechoic
mass with dependent calcifications internally at the 6 o'clock
subareolar location measuring approximately 1.0 x 0.6 x 0.9 cm,
demonstrating posterior acoustic enhancement and no internal power
Doppler flow, corresponding to the screening mammographic finding.
No suspicious solid mass or abnormal acoustic shadowing is
identified.
IMPRESSION: 1. No mammographic or sonographic evidence of malignancy involving
the LEFT breast.
2. Benign 1 cm cyst in the LOWER subareolar LEFT breast which
accounts for the screening mammographic finding.

RECOMMENDATION:
Screening mammogram in one year.(Code:7Z-[id number])

I have discussed the findings and recommendations with the patient.
If applicable, a reminder letter will be sent to the patient
regarding the next appointment.

BI-RADS CATEGORY  2: Benign.

## 2022-02-15 IMAGING — MG MM DIGITAL DIAGNOSTIC UNILAT*L* W/ TOMO W/ CAD
4 series · 4 of 12 positions shown · non-contrast
Comparison: Previous exam(s).

CLINICAL DATA: Recall from screening mammography with
tomosynthesis, possible mass involving the LOWER LEFT breast at
ANTERIOR depth visible only on the MLO images.

EXAM:
DIGITAL DIAGNOSTIC LEFT MAMMOGRAM WITH CAD AND TOMO
ULTRASOUND LEFT BREAST

[L ML synth-2D]
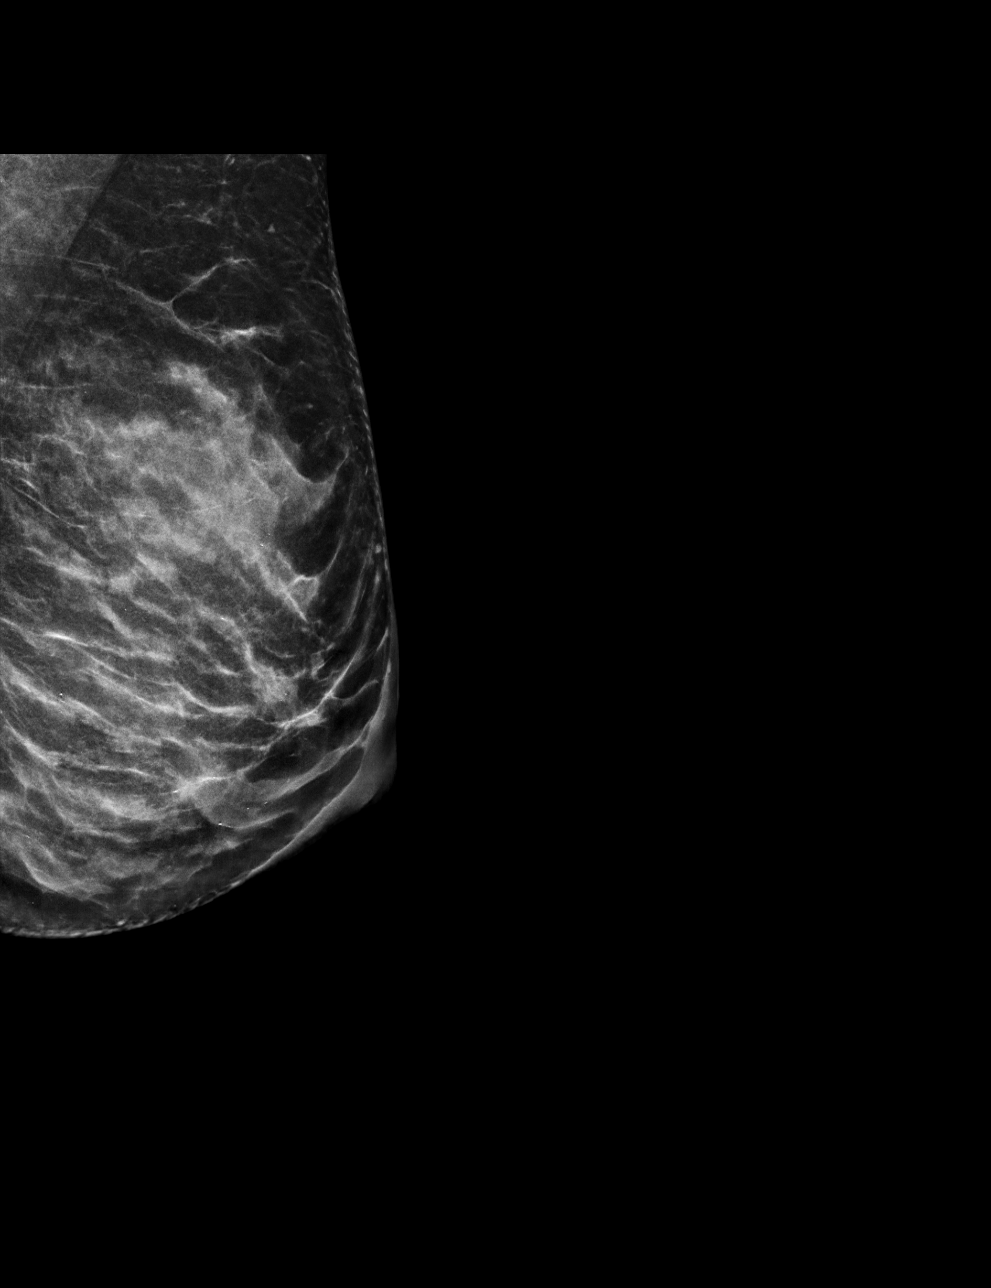

[L MLO synth-2D]
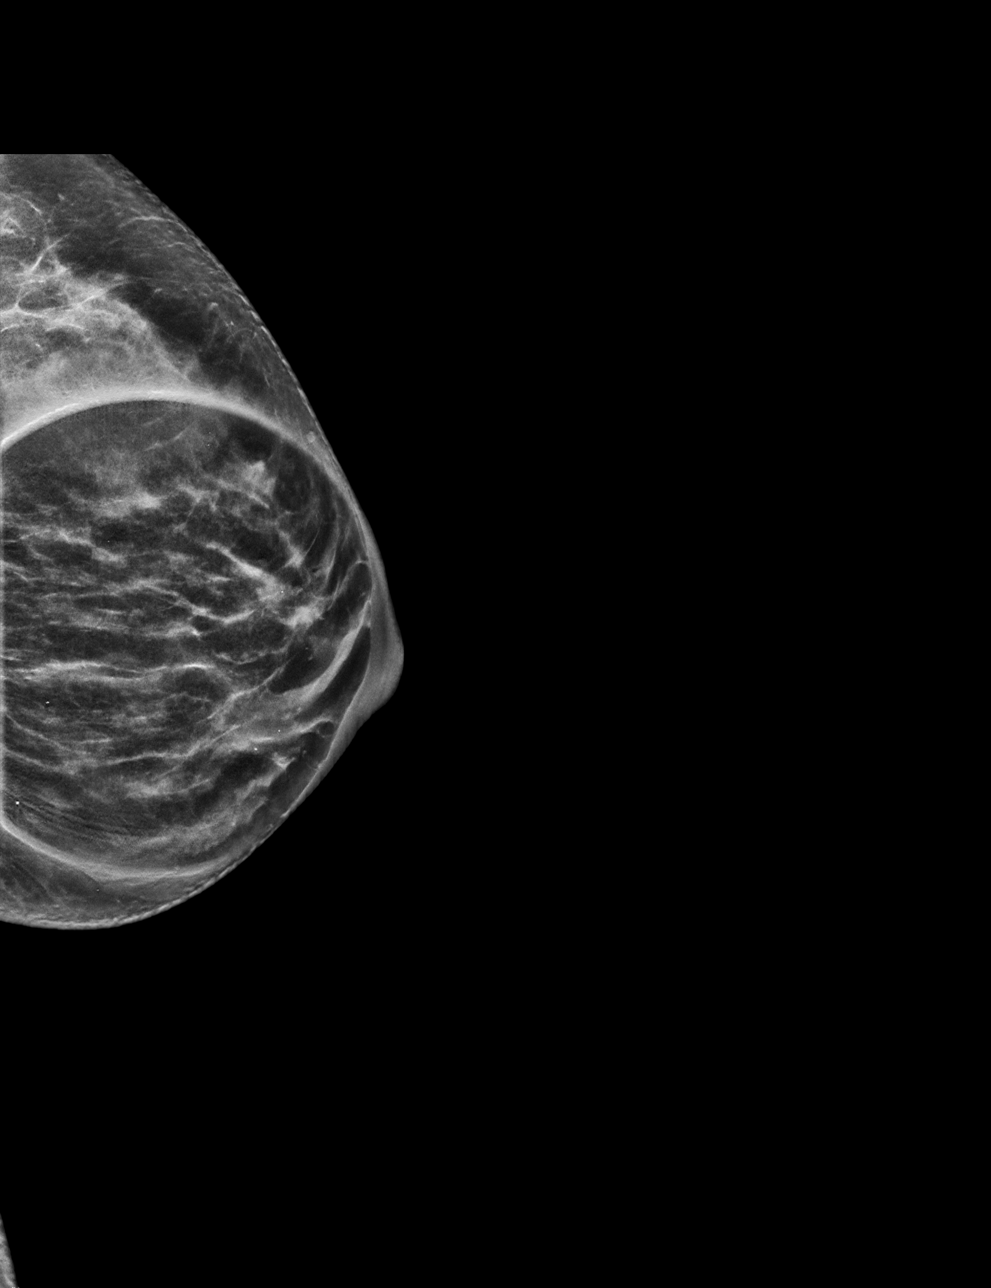

[L MLO tomo · tomo slice 27/52.0]
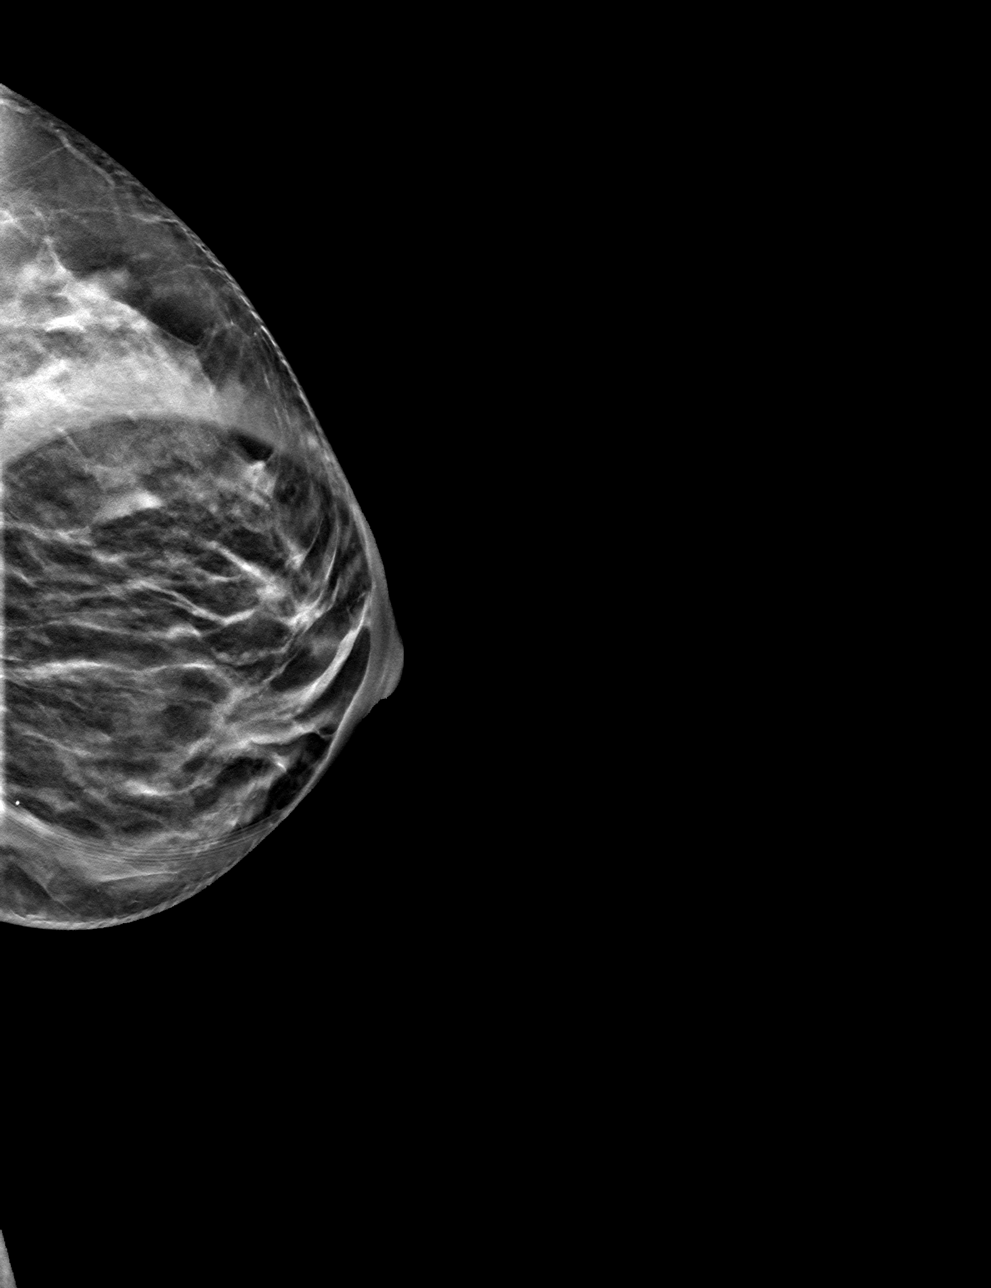

[L ML tomo · tomo slice 29/57.0]
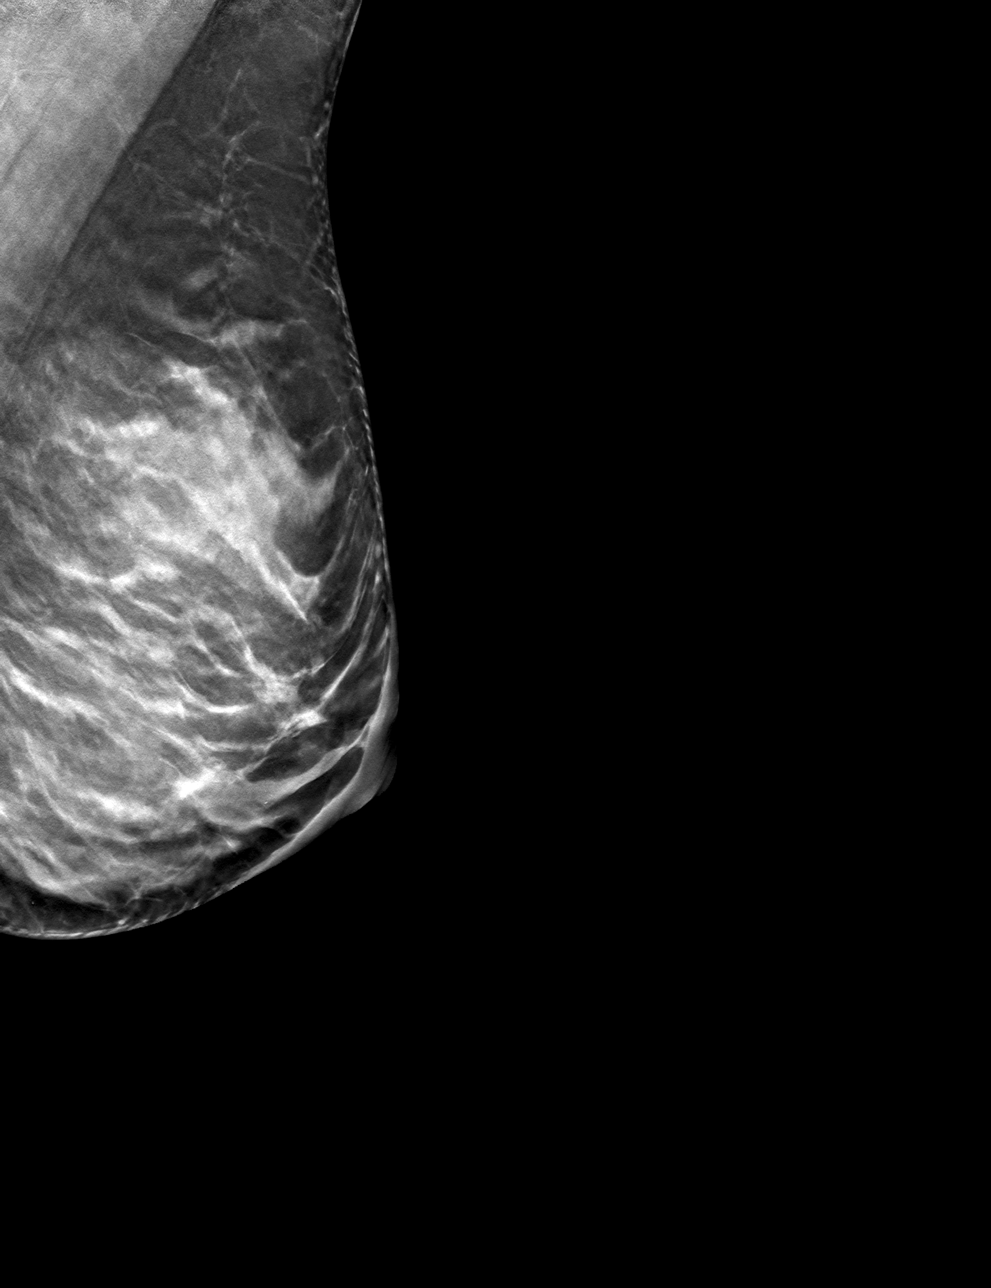

[4 of 12 positions shown; findings below may reference images not displayed]

ACR Breast Density Category d: The breast tissue is extremely dense,
which lowers the sensitivity of mammography.
FINDINGS: Tomosynthesis and synthesized spot compression MLO view of the area
of concern in the LEFT breast and a tomosynthesis and synthesized
full field mediolateral view of the LEFT breast were obtained. The
full field mediolateral image was processed with CAD.

The asymmetry in the LOWER subareolar location questioned on
screening mammography persists though partially disperses upon
compression. On the full field mediolateral images, it has the
appearance of an approximate 1 cm isodense mass without associated
architectural distortion or suspicious calcifications, though there
is a solitary calcification dependently in the mass.

No suspicious findings elsewhere in the LEFT breast on the full
field mediolateral images.

Targeted ultrasound is performed, showing a circumscribed anechoic
mass with dependent calcifications internally at the 6 o'clock
subareolar location measuring approximately 1.0 x 0.6 x 0.9 cm,
demonstrating posterior acoustic enhancement and no internal power
Doppler flow, corresponding to the screening mammographic finding.
No suspicious solid mass or abnormal acoustic shadowing is
identified.
IMPRESSION: 1. No mammographic or sonographic evidence of malignancy involving
the LEFT breast.
2. Benign 1 cm cyst in the LOWER subareolar LEFT breast which
accounts for the screening mammographic finding.

RECOMMENDATION:
Screening mammogram in one year.(Code:7Z-[id number])

I have discussed the findings and recommendations with the patient.
If applicable, a reminder letter will be sent to the patient
regarding the next appointment.

BI-RADS CATEGORY  2: Benign.

## 2022-03-02 ENCOUNTER — Other Ambulatory Visit: Payer: Self-pay | Admitting: Orthopedic Surgery

## 2022-03-02 DIAGNOSIS — R0981 Nasal congestion: Secondary | ICD-10-CM

## 2022-04-03 ENCOUNTER — Other Ambulatory Visit: Payer: BC Managed Care – PPO

## 2022-04-03 DIAGNOSIS — E119 Type 2 diabetes mellitus without complications: Secondary | ICD-10-CM

## 2022-04-04 ENCOUNTER — Other Ambulatory Visit: Payer: Self-pay | Admitting: Orthopedic Surgery

## 2022-04-04 DIAGNOSIS — E119 Type 2 diabetes mellitus without complications: Secondary | ICD-10-CM

## 2022-04-04 LAB — HEMOGLOBIN A1C
Hgb A1c MFr Bld: 7 % of total Hgb — ABNORMAL HIGH (ref <5.7)
Mean Plasma Glucose: 154 mg/dL
eAG (mmol/L): 8.5 mmol/L

## 2022-05-07 DIAGNOSIS — Z01419 Encounter for gynecological examination (general) (routine) without abnormal findings: Secondary | ICD-10-CM | POA: Diagnosis not present

## 2022-05-07 DIAGNOSIS — Z6826 Body mass index (BMI) 26.0-26.9, adult: Secondary | ICD-10-CM | POA: Diagnosis not present

## 2022-05-07 DIAGNOSIS — Z1231 Encounter for screening mammogram for malignant neoplasm of breast: Secondary | ICD-10-CM | POA: Diagnosis not present

## 2022-05-07 LAB — HM MAMMOGRAPHY

## 2022-06-05 ENCOUNTER — Encounter: Payer: Self-pay | Admitting: Orthopedic Surgery

## 2022-06-23 ENCOUNTER — Other Ambulatory Visit: Payer: Self-pay | Admitting: Orthopedic Surgery

## 2022-07-26 ENCOUNTER — Ambulatory Visit: Payer: BC Managed Care – PPO | Admitting: Orthopedic Surgery

## 2022-07-26 ENCOUNTER — Encounter: Payer: Self-pay | Admitting: Orthopedic Surgery

## 2022-07-26 VITALS — BP 120/86 | HR 58 | Temp 97.0°F | Resp 16 | Ht 67.0 in | Wt 161.2 lb

## 2022-07-26 DIAGNOSIS — E781 Pure hyperglyceridemia: Secondary | ICD-10-CM | POA: Diagnosis not present

## 2022-07-26 DIAGNOSIS — E785 Hyperlipidemia, unspecified: Secondary | ICD-10-CM

## 2022-07-26 DIAGNOSIS — E119 Type 2 diabetes mellitus without complications: Secondary | ICD-10-CM | POA: Diagnosis not present

## 2022-07-26 DIAGNOSIS — E1169 Type 2 diabetes mellitus with other specified complication: Secondary | ICD-10-CM

## 2022-07-26 DIAGNOSIS — I10 Essential (primary) hypertension: Secondary | ICD-10-CM

## 2022-07-26 DIAGNOSIS — Z1159 Encounter for screening for other viral diseases: Secondary | ICD-10-CM

## 2022-07-26 NOTE — Progress Notes (Signed)
Careteam: Patient Care Team: Octavia Heir, NP as PCP - General (Adult Health Nurse Practitioner) [name], [facility]  Seen by: Hazle Nordmann, AGNP-C  PLACE OF SERVICE:  [facility]  Advanced Directive information Does Patient Have a Medical Advance Directive?: No, Would patient like information on creating a medical advance directive?: No - Patient declined  Allergies  Allergen Reactions   Fenofibrate Other (See Comments)    Only took for one month after she developed difficulty urinating   Crestor [Rosuvastatin] Other (See Comments)    Muscle cramps   Lipitor [Atorvastatin] Other (See Comments)    Muscle cramps   Metformin And Related Palpitations   Pravachol [Pravastatin Sodium] Other (See Comments)    Muscle cramps in legs   Other     Cranberries    Penicillins     Pt was told by father  NOT to take Penicillins.   Sulfa Antibiotics    Vioxx [Rofecoxib]     No chief complaint on file.    HPI: Patient is a 56 y.o. female seen today for medical management of chronic conditions.   No health concerns. No recent falls or injuries.   Fasting for lab work today.   BP at goal with HCTZ.   Eating more salads, fruits and vegetables in diet. Weight down 5 lbs.   Diabetic eye exam next month.   Discussed shingrix vaccine.   Review of Systems:  Review of Systems  Constitutional:  Negative for fever and malaise/fatigue.  HENT:  Negative for congestion and sore throat.   Eyes:  Negative for redness.  Respiratory:  Negative for cough, shortness of breath and wheezing.   Cardiovascular:  Negative for chest pain and leg swelling.  Gastrointestinal:  Negative for abdominal pain and blood in stool.  Genitourinary:  Negative for dysuria.  Musculoskeletal:  Positive for joint pain. Negative for falls.  Skin:  Negative for rash.  Neurological:  Negative for dizziness, weakness and headaches.  Endo/Heme/Allergies:   Negative for polydipsia.  Psychiatric/Behavioral:  Negative for depression and memory loss. The patient is not nervous/anxious and does not have insomnia.      Past Medical History:  Diagnosis Date   Abdominal pain, right lower quadrant    Acute frontal sinusitis    Acute upper respiratory infections of unspecified site    Allergic rhinitis, cause unspecified    Anemia, unspecified    Anxiety state, unspecified    Candidiasis of vulva and vagina    Contact dermatitis and other eczema, due to unspecified cause    Dizziness and giddiness    Dysmenorrhea    Dysuria    Dysuria    Edema    Esophageal reflux    Essential hypertension, benign    External hemorrhoids without mention of complication    Headache(784.0)    Hyperpotassemia    Hypopotassemia    Iron deficiency anemia, unspecified    Loss of weight    Lumbago    Neoplasm of uncertain behavior of skin    Nontoxic uninodular goiter    Other abnormal blood chemistry    Other abnormal blood chemistry    Other and unspecified hyperlipidemia    Palpitations    Pure hypercholesterolemia    Reflux esophagitis    Routine general medical examination at a health care facility    Routine gynecological examination    Shortness of breath    Sprain of tibiofibular (ligament), distal of ankle  Unspecified constipation    Unspecified essential hypertension    Unspecified pruritic disorder    Urinary tract infection, site not specified    History reviewed. No pertinent surgical history. Social History:   reports that she has never smoked. She has never used smokeless tobacco. She reports that she does not drink alcohol and does not use drugs.  History reviewed. No pertinent family history.  Medications: Patient's Medications  New Prescriptions   No medications on file  Previous Medications   ASPIRIN 81 MG EC TABLET    Take 1 tablet (81 mg total) by mouth daily. Swallow whole.   CETIRIZINE (ZYRTEC) 10 MG TABLET    Take 10  mg by mouth daily.   CHOLECALCIFEROL (VITAMIN D3) 50 MCG (2000 UT) CAPSULE    Take 1 capsule (2,000 Units total) by mouth daily.   EZETIMIBE (ZETIA) 10 MG TABLET    Take 1 tablet (10 mg total) by mouth daily.   FISH OIL-OMEGA-3 FATTY ACIDS 1000 MG CAPSULE    Take by mouth daily.   FLUTICASONE (FLONASE) 50 MCG/ACT NASAL SPRAY    SPRAY 2 SPRAYS INTO EACH NOSTRIL EVERY DAY   HYDROCHLOROTHIAZIDE (HYDRODIURIL) 25 MG TABLET    TAKE 1 TABLET (25 MG TOTAL) BY MOUTH DAILY.   POLYETHYLENE GLYCOL POWDER (GLYCOLAX/MIRALAX) POWDER    Take 17 g by mouth daily.  Modified Medications   No medications on file  Discontinued Medications   No medications on file    Physical Exam:  Vitals:   07/26/22 0839  BP: 120/86  Pulse: (!) 58  Resp: 16  Temp: (!) 97 F (36.1 C)  SpO2: 96%  Weight: 161 lb 3.2 oz (73.1 kg)  Height: 5\' 7"  (1.702 m)   Body mass index is 25.25 kg/m. Wt Readings from Last 3 Encounters:  07/26/22 161 lb 3.2 oz (73.1 kg)  01/25/22 166 lb 8 oz (75.5 kg)  08/24/21 165 lb (74.8 kg)    Physical Exam Vitals reviewed.  Constitutional:      General: She is not in acute distress. HENT:     Head: Normocephalic.  Eyes:     General:        Right eye: No discharge.        Left eye: No discharge.  Neck:     Thyroid: No thyroid mass or thyromegaly.  Cardiovascular:     Rate and Rhythm: Normal rate and regular rhythm.     Pulses: Normal pulses.     Heart sounds: Normal heart sounds.  Pulmonary:     Effort: Pulmonary effort is normal. No respiratory distress.     Breath sounds: Normal breath sounds. No wheezing.  Abdominal:     General: Bowel sounds are normal. There is no distension.     Tenderness: There is no abdominal tenderness.  Musculoskeletal:     Cervical back: Neck supple.     Right lower leg: No edema.     Left lower leg: No edema.  Skin:    General: Skin is warm.     Capillary Refill: Capillary refill takes less than 2 seconds.  Neurological:     General: No  focal deficit present.     Mental Status: She is alert. She is disoriented.  Psychiatric:        Mood and Affect: Mood normal.     Labs reviewed: Basic Metabolic Panel: Recent Labs    08/24/21 0930 01/25/22 0908  NA 140 141  K 3.9 4.0  CL 103 101  CO2 27 29  GLUCOSE 115* 126*  BUN 18 14  CREATININE 0.67 0.69  CALCIUM 9.8 10.1   Liver Function Tests: Recent Labs    08/24/21 0930 01/25/22 0908  AST 18 21  ALT 29 36*  BILITOT 0.5 0.4  PROT 7.2 7.8   No results for input(s): "LIPASE", "AMYLASE" in the last 8760 hours. No results for input(s): "AMMONIA" in the last 8760 hours. CBC: Recent Labs    08/24/21 0930  WBC 5.5  NEUTROABS 2,745  HGB 14.2  HCT 41.2  MCV 84.1  PLT 262   Lipid Panel: Recent Labs    08/24/21 0930 01/25/22 0908  CHOL 242* 241*  HDL 50 54  LDLCALC 144* 151*  TRIG 292* 222*  CHOLHDL 4.8 4.5   TSH: No results for input(s): "TSH" in the last 8760 hours. A1C: Lab Results  Component Value Date   HGBA1C 7.0 (H) 04/03/2022     Assessment/Plan 1. Controlled type 2 diabetes mellitus without complication, without long-term current use of insulin (HCC) - A1c 7.0 03/2022 - no hypoglycemias - diet controlled due to metformin allergy - weight down 5 lbs - diabetic eye exam next month - Hemoglobin A1c - Microalbumin/Creatinine Ratio, Urine  2. Essential hypertension, benign - controlled with HCTZ - CBC with Differential/Platelet - Complete Metabolic Panel with eGFR  3. Hyperlipidemia associated with type 2 diabetes mellitus (HCC) - statin allergy - cont Zetia - cont low fat diet and avoid processed/fried foods - Lipid Panel  4. Hypertriglyceridemia - 222 (01/2022)> was 292 (08/2021) - recheck lipid panel  5. Need for hepatitis C screening test - Hepatitis C Antibody  Total time: 31 minutes. Greater than 50% of total time spent doing patient education regarding health maintenance, T2DM, HTN, and diabetic/low fat diet.     Next appt: Visit date not found  Lil Lepage Scherry Ran  [facility] [phone]

## 2022-07-26 NOTE — Patient Instructions (Signed)
Please get Shingles ( shingrix) vaccine at local pharmacy> its 2 shots a few months apart> can make you tired for a day  May try tylenol for hand arthiritis

## 2022-07-27 LAB — MICROALBUMIN / CREATININE URINE RATIO
Creatinine, Urine: 134 mg/dL (ref 20–275)
Microalb Creat Ratio: 6 mg/g creat (ref <30)
Microalb, Ur: 0.8 mg/dL

## 2022-07-27 LAB — COMPLETE METABOLIC PANEL WITH GFR
AG Ratio: 2 (calc) (ref 1.0–2.5)
ALT: 25 U/L (ref 6–29)
AST: 15 U/L (ref 10–35)
Albumin: 5 g/dL (ref 3.6–5.1)
Alkaline phosphatase (APISO): 40 U/L (ref 37–153)
BUN: 16 mg/dL (ref 7–25)
CO2: 28 mmol/L (ref 20–32)
Calcium: 9.8 mg/dL (ref 8.6–10.4)
Chloride: 102 mmol/L (ref 98–110)
Creat: 0.62 mg/dL (ref 0.50–1.03)
Globulin: 2.5 g/dL (calc) (ref 1.9–3.7)
Glucose, Bld: 125 mg/dL — ABNORMAL HIGH (ref 65–99)
Potassium: 3.9 mmol/L (ref 3.5–5.3)
Sodium: 141 mmol/L (ref 135–146)
Total Bilirubin: 0.5 mg/dL (ref 0.2–1.2)
Total Protein: 7.5 g/dL (ref 6.1–8.1)
eGFR: 103 mL/min/{1.73_m2} (ref >=60)

## 2022-07-27 LAB — CBC WITH DIFFERENTIAL/PLATELET
Absolute Monocytes: 350 cells/uL (ref 200–950)
Basophils Absolute: 48 cells/uL (ref 0–200)
Basophils Relative: 1 %
Eosinophils Absolute: 91 cells/uL (ref 15–500)
Eosinophils Relative: 1.9 %
HCT: 40.8 % (ref 35.0–45.0)
Hemoglobin: 13.8 g/dL (ref 11.7–15.5)
Lymphs Abs: 1920 cells/uL (ref 850–3900)
MCH: 28.5 pg (ref 27.0–33.0)
MCHC: 33.8 g/dL (ref 32.0–36.0)
MCV: 84.1 fL (ref 80.0–100.0)
MPV: 11.2 fL (ref 7.5–12.5)
Monocytes Relative: 7.3 %
Neutro Abs: 2390 cells/uL (ref 1500–7800)
Neutrophils Relative %: 49.8 %
Platelets: 258 10*3/uL (ref 140–400)
RBC: 4.85 10*6/uL (ref 3.80–5.10)
RDW: 13 % (ref 11.0–15.0)
Total Lymphocyte: 40 %
WBC: 4.8 10*3/uL (ref 3.8–10.8)

## 2022-07-27 LAB — LIPID PANEL
Cholesterol: 237 mg/dL — ABNORMAL HIGH (ref <200)
HDL: 49 mg/dL — ABNORMAL LOW (ref >=50)
LDL Cholesterol (Calc): 151 mg/dL (calc) — ABNORMAL HIGH
Non-HDL Cholesterol (Calc): 188 mg/dL (calc) — ABNORMAL HIGH (ref <130)
Total CHOL/HDL Ratio: 4.8 (calc) (ref <5.0)
Triglycerides: 220 mg/dL — ABNORMAL HIGH (ref <150)

## 2022-07-27 LAB — HEMOGLOBIN A1C
Hgb A1c MFr Bld: 6.8 % of total Hgb — ABNORMAL HIGH (ref <5.7)
Mean Plasma Glucose: 148 mg/dL
eAG (mmol/L): 8.2 mmol/L

## 2022-07-27 LAB — HEPATITIS C ANTIBODY: Hepatitis C Ab: NONREACTIVE

## 2022-08-20 LAB — HM DIABETES EYE EXAM

## 2022-12-31 DIAGNOSIS — L814 Other melanin hyperpigmentation: Secondary | ICD-10-CM | POA: Diagnosis not present

## 2022-12-31 DIAGNOSIS — L821 Other seborrheic keratosis: Secondary | ICD-10-CM | POA: Diagnosis not present

## 2022-12-31 DIAGNOSIS — D225 Melanocytic nevi of trunk: Secondary | ICD-10-CM | POA: Diagnosis not present

## 2022-12-31 DIAGNOSIS — L718 Other rosacea: Secondary | ICD-10-CM | POA: Diagnosis not present

## 2023-01-31 ENCOUNTER — Encounter: Payer: Self-pay | Admitting: Orthopedic Surgery

## 2023-01-31 ENCOUNTER — Ambulatory Visit (INDEPENDENT_AMBULATORY_CARE_PROVIDER_SITE_OTHER): Payer: BC Managed Care – PPO | Admitting: Orthopedic Surgery

## 2023-01-31 VITALS — BP 118/82 | HR 58 | Temp 97.1°F | Resp 16 | Ht 67.0 in | Wt 158.6 lb

## 2023-01-31 DIAGNOSIS — E785 Hyperlipidemia, unspecified: Secondary | ICD-10-CM

## 2023-01-31 DIAGNOSIS — E1165 Type 2 diabetes mellitus with hyperglycemia: Secondary | ICD-10-CM | POA: Diagnosis not present

## 2023-01-31 DIAGNOSIS — E781 Pure hyperglyceridemia: Secondary | ICD-10-CM | POA: Diagnosis not present

## 2023-01-31 DIAGNOSIS — I10 Essential (primary) hypertension: Secondary | ICD-10-CM

## 2023-01-31 DIAGNOSIS — E1169 Type 2 diabetes mellitus with other specified complication: Secondary | ICD-10-CM | POA: Diagnosis not present

## 2023-01-31 NOTE — Patient Instructions (Addendum)
[facility] with [facility]> [facility] visit and [facility]  Consider getting Shingles and tetanus vaccines

## 2023-01-31 NOTE — Progress Notes (Signed)
Careteam: Patient Care Team: Octavia Heir, NP as PCP - General (Adult Health Nurse Practitioner) [name], [facility]  Seen by: Hazle Nordmann, AGNP-C  PLACE OF SERVICE:  [facility]  Advanced Directive information Does Patient Have a Medical Advance Directive?: No, Would patient like information on creating a medical advance directive?: No - Patient declined  Allergies  Allergen Reactions   Fenofibrate Other (See Comments)    Only took for one month after she developed difficulty urinating   Crestor [Rosuvastatin] Other (See Comments)    Muscle cramps   Lipitor [Atorvastatin] Other (See Comments)    Muscle cramps   Metformin And Related Palpitations   Pravachol [Pravastatin Sodium] Other (See Comments)    Muscle cramps in legs   Other     Cranberries    Penicillins     Pt was told by father  NOT to take Penicillins.   Sulfa Antibiotics    Vioxx [Rofecoxib]     Chief Complaint  Patient presents with   Medical Management of Chronic Issues    6 month follow up.    Immunizations    Discuss the need for Shingrix vaccine, and DTAP vaccine.    Health Maintenance    Discuss the need for Eye exam, and Hemoglobin A1C.      HPI: Patient is a 57 y.o. female seen today for medical management of chronic conditions.   No health concerns.   She is fasting today.   Reports hot flashes for a couple years, not happening daily. Follow[facility] by [facility]> has mammogram done there.   BP at goal with hydrochlorothiazide.   Statin intolerance. Remains on Zetia.   A1c 6.3, recheck levels today. Eye exam done earlier this year at [facility] in [place]> record requested.   Discussed shingles and tdap vaccinations.      Review of Systems:  Review of Systems  Constitutional: Negative.   HENT: Negative.    Eyes: Negative.   Respiratory: Negative.    Cardiovascular: Negative.    Gastrointestinal: Negative.   Genitourinary: Negative.   Musculoskeletal: Negative.   Skin: Negative.   Neurological: Negative.   Psychiatric/Behavioral: Negative.      Past Medical History:  Diagnosis Date   Abdominal pain, right lower quadrant    Acute frontal sinusitis    Acute upper respiratory infections of unspecified site    Allergic rhinitis, cause unspecified    Anemia, unspecified    Anxiety state, unspecified    Candidiasis of vulva and vagina    Contact dermatitis and other eczema, due to unspecified cause    Dizziness and giddiness    Dysmenorrhea    Dysuria    Dysuria    Edema    Esophageal reflux    Essential hypertension, benign    External hemorrhoids without mention of complication    Headache(784.0)    Hyperpotassemia    Hypopotassemia    Iron deficiency anemia, unspecified    Loss of weight    Lumbago    Neoplasm of uncertain behavior of skin    Nontoxic uninodular goiter    Other abnormal blood chemistry    Other abnormal blood chemistry    Other and unspecified hyperlipidemia    Palpitations    Pure hypercholesterolemia    Reflux esophagitis    Routine general medical examination at a health care facility    Routine gynecological examination    Shortness of breath  Sprain of tibiofibular (ligament), distal of ankle    Unspecified constipation    Unspecified essential hypertension    Unspecified pruritic disorder    Urinary tract infection, site not specified    History reviewed. No pertinent surgical history. Social History:   reports that she has never smoked. She has never used smokeless tobacco. She reports that she does not drink alcohol and does not use drugs.  History reviewed. No pertinent family history.  Medications: Patient's Medications  New Prescriptions   No medications on file  Previous Medications   ASPIRIN 81 MG EC TABLET    Take 1 tablet (81 mg total) by mouth daily. Swallow whole.   CETIRIZINE (ZYRTEC) 10 MG  TABLET    Take 10 mg by mouth daily.   CHOLECALCIFEROL (VITAMIN D3) 50 MCG (2000 UT) CAPSULE    Take 1 capsule (2,000 Units total) by mouth daily.   EZETIMIBE (ZETIA) 10 MG TABLET    Take 1 tablet (10 mg total) by mouth daily.   FISH OIL-OMEGA-3 FATTY ACIDS 1000 MG CAPSULE    Take by mouth daily.   FLUTICASONE (FLONASE) 50 MCG/ACT NASAL SPRAY    SPRAY 2 SPRAYS INTO EACH NOSTRIL EVERY DAY   HYDROCHLOROTHIAZIDE (HYDRODIURIL) 25 MG TABLET    TAKE 1 TABLET (25 MG TOTAL) BY MOUTH DAILY.   POLYETHYLENE GLYCOL POWDER (GLYCOLAX/MIRALAX) POWDER    Take 17 g by mouth daily.  Modified Medications   No medications on file  Discontinued Medications   No medications on file    Physical Exam:  Vitals:   01/31/23 0847  BP: 118/82  Pulse: (!) 58  Resp: 16  Temp: (!) 97.1 F (36.2 C)  SpO2: 98%  Weight: 158 lb 9.6 oz (71.9 kg)  Height: 5\' 7"  (1.702 m)   Body mass index is 24.84 kg/m. Wt Readings from Last 3 Encounters:  01/31/23 158 lb 9.6 oz (71.9 kg)  07/26/22 161 lb 3.2 oz (73.1 kg)  01/25/22 166 lb 8 oz (75.5 kg)    Physical Exam Vitals reviewed.  Constitutional:      General: She is not in acute distress. HENT:     Head: Normocephalic.  Eyes:     General:        Right eye: No discharge.        Left eye: No discharge.  Neck:     Thyroid: No thyroid mass or thyromegaly.  Cardiovascular:     Rate and Rhythm: Normal rate and regular rhythm.     Pulses: Normal pulses.     Heart sounds: Normal heart sounds.  Pulmonary:     Effort: Pulmonary effort is normal.     Breath sounds: Normal breath sounds.  Abdominal:     General: Bowel sounds are normal.     Palpations: Abdomen is soft.  Musculoskeletal:     Cervical back: Neck supple.     Right lower leg: No edema.     Left lower leg: No edema.  Skin:    General: Skin is warm.     Capillary Refill: Capillary refill takes less than 2 seconds.  Neurological:     General: No focal deficit present.     Mental Status: She is alert  and oriented to person, place, and time.  Psychiatric:        Mood and Affect: Mood normal.     Labs reviewed: Basic Metabolic Panel: Recent Labs    07/26/22 0909  NA 141  K 3.9  CL 102  CO2 28  GLUCOSE 125*  BUN 16  CREATININE 0.62  CALCIUM 9.8   Liver Function Tests: Recent Labs    07/26/22 0909  AST 15  ALT 25  BILITOT 0.5  PROT 7.5   No results for input(s): "LIPASE", "AMYLASE" in the last 8760 hours. No results for input(s): "AMMONIA" in the last 8760 hours. CBC: Recent Labs    07/26/22 0909  WBC 4.8  NEUTROABS 2,390  HGB 13.8  HCT 40.8  MCV 84.1  PLT 258   Lipid Panel: Recent Labs    07/26/22 0909  CHOL 237*  HDL 49*  LDLCALC 151*  TRIG 220*  CHOLHDL 4.8   TSH: No results for input(s): "TSH" in the last 8760 hours. A1C: Lab Results  Component Value Date   HGBA1C 6.8 (H) 07/26/2022     Assessment/Plan 1. Controlled type 2 diabetes mellitus with hyperglycemia, without long-term current use of insulin (HCC) (Primary) - A1c 6.3 - diet controlled - no hypoglycemias - eye exam done earlier this year> record requested - cont diet low in carbs and sugars - Hemoglobin A1c  2. Essential hypertension, benign - controlled with hydrochlorothiazide - last EKG 11/2018> NSR - Basic Metabolic Panel with eGFR  3. Hyperlipidemia associated with type 2 diabetes mellitus (HCC) - total 237, LDL 151 07/2022 - statin intolerance - cont Zetia - cont diet low in fat, processed and fried foods - Lipid Panel  4. Hypertriglyceridemia - triglycerides 220 07/2022 - not on medication - consider fenofibrate if levels increase  Total time: 28 minutes. Greater than 50% of total time spent doing patient education regarding health maintenance, T2DM, HTN, HLD and elevated triglycerides including diet/medication management.    Next appt: .next Kishawn Pickar Scherry Ran  [facility] [phone]

## 2023-02-01 ENCOUNTER — Other Ambulatory Visit: Payer: Self-pay | Admitting: Orthopedic Surgery

## 2023-02-01 DIAGNOSIS — E1165 Type 2 diabetes mellitus with hyperglycemia: Secondary | ICD-10-CM

## 2023-02-01 LAB — BASIC METABOLIC PANEL WITH GFR
BUN: 20 mg/dL (ref 7–25)
CO2: 31 mmol/L (ref 20–32)
Calcium: 10.3 mg/dL (ref 8.6–10.4)
Chloride: 102 mmol/L (ref 98–110)
Creat: 0.62 mg/dL (ref 0.50–1.03)
Glucose, Bld: 109 mg/dL — ABNORMAL HIGH (ref 65–99)
Potassium: 4.2 mmol/L (ref 3.5–5.3)
Sodium: 142 mmol/L (ref 135–146)
eGFR: 103 mL/min/{1.73_m2} (ref >=60)

## 2023-02-01 LAB — LIPID PANEL
Cholesterol: 238 mg/dL — ABNORMAL HIGH (ref <200)
HDL: 54 mg/dL (ref >=50)
LDL Cholesterol (Calc): 153 mg/dL — ABNORMAL HIGH
Non-HDL Cholesterol (Calc): 184 mg/dL — ABNORMAL HIGH (ref <130)
Total CHOL/HDL Ratio: 4.4 (calc) (ref <5.0)
Triglycerides: 169 mg/dL — ABNORMAL HIGH (ref <150)

## 2023-02-01 LAB — HEMOGLOBIN A1C
Hgb A1c MFr Bld: 6.6 %{Hb} — ABNORMAL HIGH (ref <5.7)
Mean Plasma Glucose: 143 mg/dL
eAG (mmol/L): 7.9 mmol/L

## 2023-02-01 MED ORDER — EMPAGLIFLOZIN 10 MG PO TABS
10.0000 mg | ORAL_TABLET | Freq: Every day | ORAL | 6 refills | Status: AC
Start: 2023-02-01 — End: ?

## 2023-03-25 ENCOUNTER — Other Ambulatory Visit: Payer: Self-pay | Admitting: Orthopedic Surgery

## 2023-03-26 ENCOUNTER — Other Ambulatory Visit: Payer: Self-pay | Admitting: Orthopedic Surgery

## 2023-03-26 DIAGNOSIS — R0981 Nasal congestion: Secondary | ICD-10-CM

## 2023-05-08 DIAGNOSIS — L820 Inflamed seborrheic keratosis: Secondary | ICD-10-CM | POA: Diagnosis not present

## 2023-05-08 DIAGNOSIS — L538 Other specified erythematous conditions: Secondary | ICD-10-CM | POA: Diagnosis not present

## 2023-05-16 ENCOUNTER — Ambulatory Visit: Payer: BC Managed Care – PPO | Admitting: Orthopedic Surgery

## 2023-05-16 ENCOUNTER — Encounter: Payer: Self-pay | Admitting: Orthopedic Surgery

## 2023-05-16 VITALS — BP 110/70 | HR 83 | Temp 98.0°F | Resp 16 | Ht 67.0 in | Wt 157.6 lb

## 2023-05-16 DIAGNOSIS — Z1231 Encounter for screening mammogram for malignant neoplasm of breast: Secondary | ICD-10-CM | POA: Diagnosis not present

## 2023-05-16 DIAGNOSIS — Z6825 Body mass index (BMI) 25.0-25.9, adult: Secondary | ICD-10-CM | POA: Diagnosis not present

## 2023-05-16 DIAGNOSIS — I10 Essential (primary) hypertension: Secondary | ICD-10-CM

## 2023-05-16 DIAGNOSIS — E1165 Type 2 diabetes mellitus with hyperglycemia: Secondary | ICD-10-CM

## 2023-05-16 DIAGNOSIS — Z01419 Encounter for gynecological examination (general) (routine) without abnormal findings: Secondary | ICD-10-CM | POA: Diagnosis not present

## 2023-05-16 NOTE — Progress Notes (Signed)
 Careteam: Patient Care Team: Joyce Heir, NP as PCP - General (Adult Health Nurse Practitioner) [name], [facility]  Seen by: Hazle Nordmann, AGNP-C  PLACE OF SERVICE:  [facility]  Advanced Directive information Does Patient Have a Medical Advance Directive?: No, Would patient like information on creating a medical advance directive?: No - Patient declined  Allergies  Allergen Reactions   Fenofibrate Other (See Comments)    Only took for one month after she developed difficulty urinating   Crestor [Rosuvastatin] Other (See Comments)    Muscle cramps   Lipitor [Atorvastatin] Other (See Comments)    Muscle cramps   Metformin And Related Palpitations   Pravachol [Pravastatin Sodium] Other (See Comments)    Muscle cramps in legs   Other     Cranberries    Penicillins Other (See Comments)    Pt was told by father  NOT to take Penicillins.  Product containing penicillin (product)   Sulfa Antibiotics    Vioxx [Rofecoxib]     Chief Complaint  Patient presents with   Medical Management of Chronic Issues    3 month follow up. Discuss the need for DTAP vaccine, Pne vaccine, and Shingrix vaccine. Discuss the need for Eye exam.      HPI: Patient is a 58 y.o. female seen today for medical management of chronic conditions.   Discussed the use of AI scribe software for clinical note transcription with the patient, who gave verbal consent to proceed.  She was started on empagliflozin (Jardiance) in December due to an upward trend in her A1c levels. Unable to take metformin due to palpitations. Denies any side effects with Jardiance and is taking daily.   BP 110/70. She is managing her hypertension with hydrochlorothiazide (HCTZ), which she continues to take.  Discussed need for pneumonia vaccine. She will think about getting at local pharmacy.   Review of Systems:  Review of Systems  Constitutional: Negative.   HENT:  Negative.    Eyes: Negative.   Respiratory: Negative.    Cardiovascular: Negative.   Gastrointestinal: Negative.   Genitourinary: Negative.   Musculoskeletal: Negative.   Skin: Negative.   Neurological: Negative.   Endo/Heme/Allergies:  Negative for polydipsia.  Psychiatric/Behavioral: Negative.      Past Medical History:  Diagnosis Date   Abdominal pain, right lower quadrant    Acute frontal sinusitis    Acute upper respiratory infections of unspecified site    Allergic rhinitis, cause unspecified    Anemia, unspecified    Anxiety state, unspecified    Candidiasis of vulva and vagina    Contact dermatitis and other eczema, due to unspecified cause    Dizziness and giddiness    Dysmenorrhea    Dysuria    Dysuria    Edema    Esophageal reflux    Essential hypertension, benign    External hemorrhoids without mention of complication    Headache(784.0)    Hyperpotassemia    Hypopotassemia    Iron deficiency anemia, unspecified    Loss of weight    Lumbago    Neoplasm of uncertain behavior of skin    Nontoxic uninodular goiter    Other abnormal blood chemistry    Other abnormal blood chemistry    Other and unspecified hyperlipidemia    Palpitations    Pure hypercholesterolemia    Reflux esophagitis    Routine general medical examination at a health care facility    Routine gynecological examination  Shortness of breath    Sprain of tibiofibular (ligament), distal of ankle    Unspecified constipation    Unspecified essential hypertension    Unspecified pruritic disorder    Urinary tract infection, site not specified    No past surgical history on file. Social History:   reports that she has never smoked. She has never used smokeless tobacco. She reports that she does not drink alcohol and does not use drugs.  No family history on file.  Medications: Patient's Medications  New Prescriptions   No medications on file  Previous Medications   ASPIRIN 81 MG EC  TABLET    Take 1 tablet (81 mg total) by mouth daily. Swallow whole.   CETIRIZINE (ZYRTEC) 10 MG TABLET    Take 10 mg by mouth daily.   CHOLECALCIFEROL (VITAMIN D3) 50 MCG (2000 UT) CAPSULE    Take 1 capsule (2,000 Units total) by mouth daily.   EMPAGLIFLOZIN (JARDIANCE) 10 MG TABS TABLET    Take 1 tablet (10 mg total) by mouth daily before breakfast.   EZETIMIBE (ZETIA) 10 MG TABLET    Take 1 tablet (10 mg total) by mouth daily.   FISH OIL-OMEGA-3 FATTY ACIDS 1000 MG CAPSULE    Take by mouth daily.   FLUTICASONE (FLONASE) 50 MCG/ACT NASAL SPRAY    SPRAY 2 SPRAYS INTO EACH NOSTRIL EVERY DAY   HYDROCHLOROTHIAZIDE (HYDRODIURIL) 25 MG TABLET    TAKE 1 TABLET (25 MG TOTAL) BY MOUTH DAILY.   POLYETHYLENE GLYCOL POWDER (GLYCOLAX/MIRALAX) POWDER    Take 17 g by mouth daily.  Modified Medications   No medications on file  Discontinued Medications   No medications on file    Physical Exam:  Vitals:   05/16/23 1454  BP: 110/70  Pulse: 83  Resp: 16  Temp: 98 F (36.7 C)  SpO2: 95%  Weight: 157 lb 9.6 oz (71.5 kg)  Height: 5\' 7"  (1.702 m)   Body mass index is 24.68 kg/m. Wt Readings from Last 3 Encounters:  05/16/23 157 lb 9.6 oz (71.5 kg)  01/31/23 158 lb 9.6 oz (71.9 kg)  07/26/22 161 lb 3.2 oz (73.1 kg)    Physical Exam Vitals reviewed.  Constitutional:      General: She is not in acute distress. HENT:     Head: Normocephalic.  Eyes:     General:        Right eye: No discharge.        Left eye: No discharge.  Cardiovascular:     Rate and Rhythm: Normal rate and regular rhythm.     Pulses: Normal pulses.     Heart sounds: Normal heart sounds.  Pulmonary:     Effort: Pulmonary effort is normal.     Breath sounds: Normal breath sounds.  Abdominal:     Palpations: Abdomen is soft.  Musculoskeletal:     Cervical back: Neck supple.     Right lower leg: No edema.     Left lower leg: No edema.  Skin:    General: Skin is warm.     Capillary Refill: Capillary refill takes  less than 2 seconds.  Neurological:     General: No focal deficit present.     Mental Status: She is alert and oriented to person, place, and time.  Psychiatric:        Mood and Affect: Mood normal.     Labs reviewed: Basic Metabolic Panel: Recent Labs    07/26/22 0909 01/31/23 0926  NA 141 142  K 3.9 4.2  CL 102 102  CO2 28 31  GLUCOSE 125* 109*  BUN 16 20  CREATININE 0.62 0.62  CALCIUM 9.8 10.3   Liver Function Tests: Recent Labs    07/26/22 0909  AST 15  ALT 25  BILITOT 0.5  PROT 7.5   No results for input(s): "LIPASE", "AMYLASE" in the last 8760 hours. No results for input(s): "AMMONIA" in the last 8760 hours. CBC: Recent Labs    07/26/22 0909  WBC 4.8  NEUTROABS 2,390  HGB 13.8  HCT 40.8  MCV 84.1  PLT 258   Lipid Panel: Recent Labs    07/26/22 0909 01/31/23 0926  CHOL 237* 238*  HDL 49* 54  LDLCALC 151* 153*  TRIG 220* 169*  CHOLHDL 4.8 4.4   TSH: No results for input(s): "TSH" in the last 8760 hours. A1C: Lab Results  Component Value Date   HGBA1C 6.6 (H) 01/31/2023     Assessment/Plan 1. Controlled type 2 diabetes mellitus with hyperglycemia, without long-term current use of insulin (HCC) (Primary) - A1c 6.5> at goal  - cont Jardiance for glycemic control and cardiac protection - eye exam 11/2022 - unable to be on statin due to allergy - cont asa  - cont diet low in carbs and sugars - Hemoglobin A1c  2. Essential hypertension, benign - controlled with hydrochlorothiazide - BUN/creat 20/0.62 01/31/2023  Total time: 31 minutes. Greater than 50% of total time spent doing patient education regarding health maintenance, T2DM, and HTN including symptom/medication management,     Next appt: 08/08/2023  Bettina Gavia  [facility] [phone]

## 2023-05-16 NOTE — Patient Instructions (Signed)
 Fasting labs next visit in June> ok to have water and black coffee before visit

## 2023-05-17 LAB — HEMOGLOBIN A1C
Hgb A1c MFr Bld: 6.5 %{Hb} — ABNORMAL HIGH (ref <5.7)
Mean Plasma Glucose: 140 mg/dL
eAG (mmol/L): 7.7 mmol/L

## 2023-07-02 DIAGNOSIS — Z13820 Encounter for screening for osteoporosis: Secondary | ICD-10-CM | POA: Diagnosis not present

## 2023-08-08 ENCOUNTER — Encounter: Payer: Self-pay | Admitting: Orthopedic Surgery

## 2023-08-08 ENCOUNTER — Ambulatory Visit: Payer: BLUE CROSS/BLUE SHIELD | Admitting: Orthopedic Surgery

## 2023-08-08 VITALS — BP 102/72 | HR 57 | Temp 97.3°F | Resp 9 | Ht 67.0 in | Wt 156.0 lb

## 2023-08-08 DIAGNOSIS — R001 Bradycardia, unspecified: Secondary | ICD-10-CM | POA: Diagnosis not present

## 2023-08-08 DIAGNOSIS — E1165 Type 2 diabetes mellitus with hyperglycemia: Secondary | ICD-10-CM

## 2023-08-08 DIAGNOSIS — E1169 Type 2 diabetes mellitus with other specified complication: Secondary | ICD-10-CM | POA: Diagnosis not present

## 2023-08-08 DIAGNOSIS — I10 Essential (primary) hypertension: Secondary | ICD-10-CM

## 2023-08-08 DIAGNOSIS — E785 Hyperlipidemia, unspecified: Secondary | ICD-10-CM | POA: Diagnosis not present

## 2023-08-08 NOTE — Patient Instructions (Addendum)
 Try to limit frozen food and processed foods since they are higher in cholesterol  Let us  know if you want a glucometer to check your blood sugars   Cardiology consult made due to low heart rate

## 2023-08-08 NOTE — Progress Notes (Signed)
 Careteam: Patient Care Team: Arnetha Bhat, NP as PCP - General (Adult Health Nurse Practitioner) Jonette Nestle, [facility]  Seen by: Ulyses Gandy, AGNP-C  PLACE OF SERVICE:  [facility]  Advanced Directive information    Allergies  Allergen Reactions   Fenofibrate  Other (See Comments)    Only took for one month after she developed difficulty urinating   Crestor [Rosuvastatin] Other (See Comments)    Muscle cramps   Lipitor [Atorvastatin ] Other (See Comments)    Muscle cramps   Metformin  And Related Palpitations   Pravachol  [Pravastatin  Sodium] Other (See Comments)    Muscle cramps in legs   Other     Cranberries    Penicillins Other (See Comments)    Pt was told by father  NOT to take Penicillins.  Product containing penicillin (product)   Sulfa Antibiotics    Vioxx [Rofecoxib]     Chief Complaint  Patient presents with   Medical Management of Chronic Issues    6 month follow up        HPI: Patient is a 58 y.o. female seen today for medical management of chronic conditions.   Discussed the use of AI scribe software for clinical note transcription with the patient, who gave verbal consent to proceed.  History of Present Illness    Joyce Richard is a 58 year old female with hyperlipidemia and type 2 diabetes who presents for a routine checkup.  No health concerns.   She is due for a recheck of her A1c today, with her last result being 6.5. She does not monitor her blood glucose at home due to the lack of a glucometer. She avoids taking her diabetes medication if she has not eaten. No recent hypoglycemic episodes. Admits to enjoying breads and potato chips often.   She has a history of hyperlipidemia and has experienced muscle cramps with statin use in the past. Currently, she is taking Zetia  for cholesterol management.  She admits to eating a lot of frozen chicken tenders and sometimes creamy salad dressing.   .  HR< 60> EKG performed> sinus bradycardia. Denies chest pain, sob, dizziness or syncope. Requesting cardiology referral.    Review of Systems:  Review of Systems  Constitutional: Negative.   HENT: Negative.    Eyes: Negative.   Respiratory:  Negative for shortness of breath and wheezing.   Cardiovascular:  Negative for chest pain and palpitations.  Gastrointestinal:  Positive for constipation.  Genitourinary: Negative.   Musculoskeletal: Negative.   Skin: Negative.   Neurological: Negative.   Endo/Heme/Allergies:  Negative for polydipsia.  Psychiatric/Behavioral: Negative.      Past Medical History:  Diagnosis Date   Abdominal pain, right lower quadrant    Acute frontal sinusitis    Acute upper respiratory infections of unspecified site    Allergic rhinitis, cause unspecified    Anemia, unspecified    Anxiety state, unspecified    Candidiasis of vulva and vagina    Contact dermatitis and other eczema, due to unspecified cause    Dizziness and giddiness    Dysmenorrhea    Dysuria    Dysuria    Edema    Esophageal reflux    Essential hypertension, benign    External hemorrhoids without mention of complication    Headache(784.0)    Hyperpotassemia    Hypopotassemia    Iron deficiency anemia, unspecified    Loss of weight    Lumbago    Neoplasm  of uncertain behavior of skin    Nontoxic uninodular goiter    Other abnormal blood chemistry    Other abnormal blood chemistry    Other and unspecified hyperlipidemia    Palpitations    Pure hypercholesterolemia    Reflux esophagitis    Routine general medical examination at a health care facility    Routine gynecological examination    Shortness of breath    Sprain of tibiofibular (ligament), distal of ankle    Unspecified constipation    Unspecified essential hypertension    Unspecified pruritic disorder    Urinary tract infection, site not specified    History reviewed. No pertinent surgical history. Social  History:   reports that she has never smoked. She has never used smokeless tobacco. She reports that she does not drink alcohol and does not use drugs.  History reviewed. No pertinent family history.  Medications: Patient's Medications  New Prescriptions   No medications on file  Previous Medications   ASPIRIN  81 MG EC TABLET    Take 1 tablet (81 mg total) by mouth daily. Swallow whole.   CETIRIZINE (ZYRTEC) 10 MG TABLET    Take 10 mg by mouth daily.   CHOLECALCIFEROL (VITAMIN D3) 50 MCG (2000 UT) CAPSULE    Take 1 capsule (2,000 Units total) by mouth daily.   CLINDAMYCIN (CLEOCIN T) 1 % LOTION    Apply 1 % topically 2 (two) times daily.   EMPAGLIFLOZIN  (JARDIANCE ) 10 MG TABS TABLET    Take 1 tablet (10 mg total) by mouth daily before breakfast.   EZETIMIBE  (ZETIA ) 10 MG TABLET    Take 1 tablet (10 mg total) by mouth daily.   FISH OIL-OMEGA-3 FATTY ACIDS 1000 MG CAPSULE    Take by mouth daily.   FLUTICASONE  (FLONASE ) 50 MCG/ACT NASAL SPRAY    SPRAY 2 SPRAYS INTO EACH NOSTRIL EVERY DAY   HYDROCHLOROTHIAZIDE  (HYDRODIURIL ) 25 MG TABLET    TAKE 1 TABLET (25 MG TOTAL) BY MOUTH DAILY.   POLYETHYLENE GLYCOL POWDER (GLYCOLAX /MIRALAX ) POWDER    Take 17 g by mouth daily.  Modified Medications   No medications on file  Discontinued Medications   No medications on file    Physical Exam:  Vitals:   08/08/23 0906  BP: 102/72  Pulse: (!) 57  Resp: (!) 9  Temp: (!) 97.3 F (36.3 C)  TempSrc: Temporal  SpO2: 97%  Weight: 156 lb (70.8 kg)  Height: 5' 7 (1.702 m)   Body mass index is 24.43 kg/m. Wt Readings from Last 3 Encounters:  08/08/23 156 lb (70.8 kg)  05/16/23 157 lb 9.6 oz (71.5 kg)  01/31/23 158 lb 9.6 oz (71.9 kg)    Physical Exam Vitals reviewed.  Constitutional:      General: She is not in acute distress. HENT:     Head: Normocephalic.   Eyes:     General:        Right eye: No discharge.        Left eye: No discharge.   Neck:     Vascular: No carotid bruit.    Cardiovascular:     Rate and Rhythm: Regular rhythm. Bradycardia present.     Pulses: Normal pulses.     Heart sounds: Normal heart sounds.  Pulmonary:     Effort: Pulmonary effort is normal.     Breath sounds: Normal breath sounds.  Abdominal:     General: There is no distension.     Palpations: Abdomen is soft.  Tenderness: There is no abdominal tenderness.   Musculoskeletal:     Cervical back: Neck supple.     Right lower leg: No edema.     Left lower leg: No edema.  Lymphadenopathy:     Cervical: No cervical adenopathy.   Skin:    General: Skin is warm.     Capillary Refill: Capillary refill takes less than 2 seconds.   Neurological:     General: No focal deficit present.     Mental Status: She is alert and oriented to person, place, and time.   Psychiatric:        Mood and Affect: Mood normal.    Labs reviewed: Basic Metabolic Panel: Recent Labs    01/31/23 0926  NA 142  K 4.2  CL 102  CO2 31  GLUCOSE 109*  BUN 20  CREATININE 0.62  CALCIUM  10.3   Liver Function Tests: No results for input(s): AST, ALT, ALKPHOS, BILITOT, PROT, ALBUMIN in the last 8760 hours. No results for input(s): LIPASE, AMYLASE in the last 8760 hours. No results for input(s): AMMONIA in the last 8760 hours. CBC: No results for input(s): WBC, NEUTROABS, HGB, HCT, MCV, PLT in the last 8760 hours. Lipid Panel: Recent Labs    01/31/23 0926  CHOL 238*  HDL 54  LDLCALC 153*  TRIG 169*  CHOLHDL 4.4   TSH: No results for input(s): TSH in the last 8760 hours. A1C: Lab Results  Component Value Date   HGBA1C 6.5 (H) 05/16/2023     Assessment/Plan 1. Controlled type 2 diabetes mellitus with hyperglycemia, without long-term current use of insulin (HCC) (Primary) - A1c 6.5, goal < 7 - managed with Jardiance  and diet - discussed low carb/sugar diet  - no hypoglycemias - Eye exam 08/20/2022 - does not want glucometer at this time  -  Microalbumin/Creatinine Ratio, Urine - Hemoglobin A1c  2. Essential hypertension, benign - controlled with hydrochlorothiazide  - BUN/creat 20/0.62 01/2024 - CBC with Differential/Platelet - Basic Metabolic Panel - EKG 12-Lead  3. Hyperlipidemia associated with type 2 diabetes mellitus (HCC) - cholesterol 238, LDL 153 01/31/2023 - allergy to statins - cont Zetia  - discussed reducing frozen foods/ processed foods in diet - Lipid Panel  4. Bradycardia - EKG with sinus bradycardia with possible anterior ischemia - asymptomatic - Ambulatory referral to Cardiology   Total time: 36 minutes. Greater than 50% of total time spent doing patient education regarding health maintenance, T2DM, HTN, HLD and bradycardia including symptom/medication management.    Next appt: Jacion Dismore Delaine Favorite, NP  [facility]re[facility] Fuerte Darral Ellis  [facility] [phone]

## 2023-08-09 ENCOUNTER — Ambulatory Visit: Payer: Self-pay | Admitting: Orthopedic Surgery

## 2023-08-09 LAB — CBC WITH DIFFERENTIAL/PLATELET
Absolute Lymphocytes: 2069 {cells}/uL (ref 850–3900)
Absolute Monocytes: 413 {cells}/uL (ref 200–950)
Basophils Absolute: 38 {cells}/uL (ref 0–200)
Basophils Relative: 0.8 %
Eosinophils Absolute: 82 {cells}/uL (ref 15–500)
Eosinophils Relative: 1.7 %
HCT: 42.6 % (ref 35.0–45.0)
Hemoglobin: 13.7 g/dL (ref 11.7–15.5)
MCH: 28.3 pg (ref 27.0–33.0)
MCHC: 32.2 g/dL (ref 32.0–36.0)
MCV: 88 fL (ref 80.0–100.0)
MPV: 11.1 fL (ref 7.5–12.5)
Monocytes Relative: 8.6 %
Neutro Abs: 2198 {cells}/uL (ref 1500–7800)
Neutrophils Relative %: 45.8 %
Platelets: 265 10*3/uL (ref 140–400)
RBC: 4.84 10*6/uL (ref 3.80–5.10)
RDW: 13.1 % (ref 11.0–15.0)
Total Lymphocyte: 43.1 %
WBC: 4.8 10*3/uL (ref 3.8–10.8)

## 2023-08-09 LAB — BASIC METABOLIC PANEL WITH GFR
BUN: 12 mg/dL (ref 7–25)
CO2: 31 mmol/L (ref 20–32)
Calcium: 9.8 mg/dL (ref 8.6–10.4)
Chloride: 101 mmol/L (ref 98–110)
Creat: 0.66 mg/dL (ref 0.50–1.03)
Glucose, Bld: 107 mg/dL — ABNORMAL HIGH (ref 65–99)
Potassium: 3.8 mmol/L (ref 3.5–5.3)
Sodium: 141 mmol/L (ref 135–146)
eGFR: 101 mL/min/{1.73_m2} (ref >=60)

## 2023-08-09 LAB — LIPID PANEL
Cholesterol: 218 mg/dL — ABNORMAL HIGH (ref <200)
HDL: 52 mg/dL (ref >=50)
LDL Cholesterol (Calc): 132 mg/dL — ABNORMAL HIGH
Non-HDL Cholesterol (Calc): 166 mg/dL — ABNORMAL HIGH (ref <130)
Total CHOL/HDL Ratio: 4.2 (calc) (ref <5.0)
Triglycerides: 197 mg/dL — ABNORMAL HIGH (ref <150)

## 2023-08-09 LAB — MICROALBUMIN / CREATININE URINE RATIO
Creatinine, Urine: 143 mg/dL (ref 20–275)
Microalb Creat Ratio: 7 mg/g{creat} (ref <30)
Microalb, Ur: 1 mg/dL

## 2023-08-09 LAB — HEMOGLOBIN A1C
Hgb A1c MFr Bld: 6.4 % — ABNORMAL HIGH (ref <5.7)
Mean Plasma Glucose: 137 mg/dL
eAG (mmol/L): 7.6 mmol/L

## 2023-10-11 DIAGNOSIS — D485 Neoplasm of uncertain behavior of skin: Secondary | ICD-10-CM | POA: Diagnosis not present

## 2023-10-11 DIAGNOSIS — L821 Other seborrheic keratosis: Secondary | ICD-10-CM | POA: Diagnosis not present

## 2023-12-19 ENCOUNTER — Other Ambulatory Visit: Payer: Self-pay | Admitting: Orthopedic Surgery

## 2024-01-01 DIAGNOSIS — D1801 Hemangioma of skin and subcutaneous tissue: Secondary | ICD-10-CM | POA: Diagnosis not present

## 2024-01-01 DIAGNOSIS — L821 Other seborrheic keratosis: Secondary | ICD-10-CM | POA: Diagnosis not present

## 2024-01-01 DIAGNOSIS — L814 Other melanin hyperpigmentation: Secondary | ICD-10-CM | POA: Diagnosis not present

## 2024-01-30 ENCOUNTER — Encounter: Payer: Self-pay | Admitting: Orthopedic Surgery

## 2024-01-30 ENCOUNTER — Ambulatory Visit: Payer: Self-pay | Admitting: Orthopedic Surgery

## 2024-01-30 VITALS — BP 124/80 | HR 64 | Temp 97.4°F | Ht 67.0 in | Wt 157.8 lb

## 2024-01-30 DIAGNOSIS — E785 Hyperlipidemia, unspecified: Secondary | ICD-10-CM

## 2024-01-30 DIAGNOSIS — E1165 Type 2 diabetes mellitus with hyperglycemia: Secondary | ICD-10-CM | POA: Diagnosis not present

## 2024-01-30 DIAGNOSIS — R001 Bradycardia, unspecified: Secondary | ICD-10-CM | POA: Diagnosis not present

## 2024-01-30 DIAGNOSIS — E1169 Type 2 diabetes mellitus with other specified complication: Secondary | ICD-10-CM | POA: Diagnosis not present

## 2024-01-30 DIAGNOSIS — Z7984 Long term (current) use of oral hypoglycemic drugs: Secondary | ICD-10-CM

## 2024-01-30 DIAGNOSIS — I10 Essential (primary) hypertension: Secondary | ICD-10-CM | POA: Diagnosis not present

## 2024-01-30 NOTE — Progress Notes (Signed)
 Careteam: Patient Care Team: Gil Greig BRAVO, NP as PCP - General (Adult Health Nurse Practitioner) [place], [facility]  Seen by: Greig Gil, AGNP-C  PLACE OF SERVICE:  [facility]  Advanced Directive information    Allergies[1]  Chief Complaint  Patient presents with   Follow-up    6 month follow up     HPI: Patient is a 58 y.o. female seen today for medical management of chronic conditions.   Discussed the use of AI scribe software for clinical note transcription with the patient, who gave verbal consent to proceed.  History of Present Illness   Andre Gallego is a 58 year old female with bradycardia and hyperlipidemia who presents for a six-month follow-up.  She has no current symptoms related to her bradycardia, such as chest pain, shortness of breath, dizziness, or fainting. Her heart rate was previously noted to be low on an EKG, and she was referred to cardiology but has not yet been contacted for an appointment. She regularly checks her heart rate at home using a pulse oximeter. HR appears to be around 60.   Her cholesterol levels remain elevated. She is taking Zetia  daily for her cholesterol. She has been eating healthier, including 'crunchy salads', to manage her diabetes and cholesterol. Unsuccessful trial statin in past due to muscle cramps.   She has a history of diabetes and is mindful of her diet to keep her blood sugar levels down. Her weight has remained stable at 157 pounds since her last visit six months ago.  She had her eyes examined in October and reports her vision is fine.   She also had a mammogram and Pap smear done with Dr. Johnnye within past year.   No recent falls or injuries.      Review of Systems:  Review of Systems  Constitutional: Negative.   HENT: Negative.    Eyes: Negative.   Respiratory: Negative.    Cardiovascular: Negative.   Gastrointestinal: Negative.    Genitourinary: Negative.   Musculoskeletal: Negative.   Skin: Negative.   Neurological: Negative.   Psychiatric/Behavioral: Negative.      Past Medical History:  Diagnosis Date   Abdominal pain, right lower quadrant    Acute frontal sinusitis    Acute upper respiratory infections of unspecified site    Allergic rhinitis, cause unspecified    Anemia, unspecified    Anxiety state, unspecified    Candidiasis of vulva and vagina    Contact dermatitis and other eczema, due to unspecified cause    Dizziness and giddiness    Dysmenorrhea    Dysuria    Dysuria    Edema    Esophageal reflux    Essential hypertension, benign    External hemorrhoids without mention of complication    Headache(784.0)    Hyperpotassemia    Hypopotassemia    Iron deficiency anemia, unspecified    Loss of weight    Lumbago    Neoplasm of uncertain behavior of skin    Nontoxic uninodular goiter    Other abnormal blood chemistry    Other abnormal blood chemistry    Other and unspecified hyperlipidemia    Palpitations    Pure hypercholesterolemia    Reflux esophagitis    Routine general medical examination at a health care facility    Routine gynecological examination    Shortness of breath    Sprain of tibiofibular (ligament), distal of ankle    Unspecified constipation  Unspecified essential hypertension    Unspecified pruritic disorder    Urinary tract infection, site not specified    History reviewed. No pertinent surgical history. Social History:   reports that she has never smoked. She has never used smokeless tobacco. She reports that she does not drink alcohol and does not use drugs.  History reviewed. No pertinent family history.  Medications: Patient's Medications  New Prescriptions   No medications on file  Previous Medications   ASPIRIN  81 MG EC TABLET    Take 1 tablet (81 mg total) by mouth daily. Swallow whole.   CETIRIZINE (ZYRTEC) 10 MG TABLET    Take 10 mg by mouth  daily.   CHOLECALCIFEROL (VITAMIN D3) 50 MCG (2000 UT) CAPSULE    Take 1 capsule (2,000 Units total) by mouth daily.   CLINDAMYCIN (CLEOCIN T) 1 % LOTION    Apply 1 % topically 2 (two) times daily.   EMPAGLIFLOZIN  (JARDIANCE ) 10 MG TABS TABLET    Take 1 tablet (10 mg total) by mouth daily before breakfast.   EZETIMIBE  (ZETIA ) 10 MG TABLET    Take 1 tablet (10 mg total) by mouth daily.   FISH OIL-OMEGA-3 FATTY ACIDS 1000 MG CAPSULE    Take by mouth daily.   FLUTICASONE  (FLONASE ) 50 MCG/ACT NASAL SPRAY    SPRAY 2 SPRAYS INTO EACH NOSTRIL EVERY DAY   HYDROCHLOROTHIAZIDE  (HYDRODIURIL ) 25 MG TABLET    TAKE 1 TABLET (25 MG TOTAL) BY MOUTH DAILY. NEEDS MAIL ORDER   POLYETHYLENE GLYCOL POWDER (GLYCOLAX /MIRALAX ) POWDER    Take 17 g by mouth daily.  Modified Medications   No medications on file  Discontinued Medications   No medications on file    Physical Exam:  Vitals:   01/30/24 0844  BP: 124/80  Pulse: 61  Temp: (!) 97.4 F (36.3 C)  SpO2: 96%  Weight: 157 lb 12.8 oz (71.6 kg)  Height: 5' 7 (1.702 m)   Body mass index is 24.71 kg/m. Wt Readings from Last 3 Encounters:  01/30/24 157 lb 12.8 oz (71.6 kg)  08/08/23 156 lb (70.8 kg)  05/16/23 157 lb 9.6 oz (71.5 kg)    Physical Exam Vitals reviewed.  Constitutional:      General: She is not in acute distress. HENT:     Head: Normocephalic.  Eyes:     General:        Right eye: No discharge.        Left eye: No discharge.  Neck:     Thyroid : No thyroid  mass or thyromegaly.  Cardiovascular:     Rate and Rhythm: Normal rate and regular rhythm.     Pulses: Normal pulses.     Heart sounds: Normal heart sounds.  Pulmonary:     Effort: Pulmonary effort is normal.     Breath sounds: Normal breath sounds.  Abdominal:     General: Bowel sounds are normal. There is no distension.     Palpations: Abdomen is soft.     Tenderness: There is no abdominal tenderness.  Musculoskeletal:     Cervical back: Neck supple.     Right lower  leg: No edema.     Left lower leg: No edema.  Skin:    General: Skin is warm and dry.     Capillary Refill: Capillary refill takes less than 2 seconds.  Neurological:     General: No focal deficit present.     Mental Status: She is alert and oriented to person, place, and time.  Psychiatric:        Mood and Affect: Mood normal.     Labs reviewed: Basic Metabolic Panel: Recent Labs    01/31/23 0926 08/08/23 0953  NA 142 141  K 4.2 3.8  CL 102 101  CO2 31 31  GLUCOSE 109* 107*  BUN 20 12  CREATININE 0.62 0.66  CALCIUM  10.3 9.8   Liver Function Tests: No results for input(s): AST, ALT, ALKPHOS, BILITOT, PROT, ALBUMIN in the last 8760 hours. No results for input(s): LIPASE, AMYLASE in the last 8760 hours. No results for input(s): AMMONIA in the last 8760 hours. CBC: Recent Labs    08/08/23 0953  WBC 4.8  NEUTROABS 2,198  HGB 13.7  HCT 42.6  MCV 88.0  PLT 265   Lipid Panel: Recent Labs    01/31/23 0926 08/08/23 0953  CHOL 238* 218*  HDL 54 52  LDLCALC 153* 132*  TRIG 169* 197*  CHOLHDL 4.4 4.2   TSH: No results for input(s): TSH in the last 8760 hours. A1C: Lab Results  Component Value Date   HGBA1C 6.4 (H) 08/08/2023     Assessment/Plan 1. Controlled type 2 diabetes mellitus with hyperglycemia, without long-term current use of insulin (HCC) (Primary) - A1c 6.4 07/2023 - no hypoglycemia - eye exam with [facility] - cont metformin  - Hemoglobin A1c - Basic Metabolic Panel with eGFR  2. Essential hypertension, benign - controlled, goal < 140/80 - BUN/creat 12/0.66 08/08/2023 - cont HCTZ - CBC with Differential/Platelet  3. Hyperlipidemia associated with type 2 diabetes mellitus (HCC) - LDL> 130 - unsuccessful trial statins due to muscle cramps - if LDL continues to be elevated> consider Repatha  - Lipid Panel  4. Bradycardia - last EKG sinus bradycardia - she did not schedule with cardiology - Apical  pulse 64 - asymptomatic - will hold off on referral  - advised to take pulse a few times a day and bring readings next encounter  Total time: 31 minutes. Greater than 50% of total time spent doing patient education regarding health maintenance, HTN, T2DM and HLD including symptom/medication management.    Next appt: Visit date not found  Chanteria Haggard Gil BODILY  [facility] [phone]     [1]  Allergies Allergen Reactions   Fenofibrate  Other (See Comments)    Only took for one month after she developed difficulty urinating   Crestor [Rosuvastatin] Other (See Comments)    Muscle cramps   Lipitor [Atorvastatin ] Other (See Comments)    Muscle cramps   Metformin  And Related Palpitations   Pravachol  [Pravastatin  Sodium] Other (See Comments)    Muscle cramps in legs   Other     Cranberries    Penicillins Other (See Comments)    Pt was told by father  NOT to take Penicillins.  Product containing penicillin (product)   Sulfa Antibiotics    Vioxx [Rofecoxib]

## 2024-01-30 NOTE — Patient Instructions (Addendum)
 Please check your heart rate with pulse oximeter> check twice daily x 2 weeks> bring readings next visit   If you do not feel well> check heart rate   Please make sure you have yearly appointment with Dr. Johnnye ( gynecology> yearly) > [phone]

## 2024-01-31 ENCOUNTER — Ambulatory Visit: Payer: Self-pay | Admitting: Orthopedic Surgery

## 2024-01-31 DIAGNOSIS — E1169 Type 2 diabetes mellitus with other specified complication: Secondary | ICD-10-CM

## 2024-01-31 LAB — CBC WITH DIFFERENTIAL/PLATELET
Absolute Lymphocytes: 1945 {cells}/uL (ref 850–3900)
Absolute Monocytes: 417 {cells}/uL (ref 200–950)
Basophils Absolute: 59 {cells}/uL (ref 0–200)
Basophils Relative: 1.2 %
Eosinophils Absolute: 88 {cells}/uL (ref 15–500)
Eosinophils Relative: 1.8 %
HCT: 41.5 % (ref 35.9–46.0)
Hemoglobin: 13.7 g/dL (ref 11.7–15.5)
MCH: 28.8 pg (ref 27.0–33.0)
MCHC: 33 g/dL (ref 31.6–35.4)
MCV: 87.2 fL (ref 81.4–101.7)
MPV: 11 fL (ref 7.5–12.5)
Monocytes Relative: 8.5 %
Neutro Abs: 2391 {cells}/uL (ref 1500–7800)
Neutrophils Relative %: 48.8 %
Platelets: 279 Thousand/uL (ref 140–400)
RBC: 4.76 Million/uL (ref 3.80–5.10)
RDW: 13 % (ref 11.0–15.0)
Total Lymphocyte: 39.7 %
WBC: 4.9 Thousand/uL (ref 3.8–10.8)

## 2024-01-31 LAB — HEMOGLOBIN A1C
Hgb A1c MFr Bld: 6.2 % — ABNORMAL HIGH (ref <5.7)
Mean Plasma Glucose: 131 mg/dL
eAG (mmol/L): 7.3 mmol/L

## 2024-01-31 LAB — BASIC METABOLIC PANEL WITHOUT GFR
BUN: 19 mg/dL (ref 7–25)
CO2: 30 mmol/L (ref 20–32)
Calcium: 9.9 mg/dL (ref 8.6–10.4)
Chloride: 102 mmol/L (ref 98–110)
Creat: 0.68 mg/dL (ref 0.50–1.05)
Glucose, Bld: 104 mg/dL — ABNORMAL HIGH (ref 65–99)
Potassium: 3.9 mmol/L (ref 3.5–5.3)
Sodium: 142 mmol/L (ref 135–146)

## 2024-01-31 LAB — LIPID PANEL
Cholesterol: 218 mg/dL — ABNORMAL HIGH (ref <200)
HDL: 56 mg/dL (ref >=50)
LDL Cholesterol (Calc): 132 mg/dL — ABNORMAL HIGH
Non-HDL Cholesterol (Calc): 162 mg/dL — ABNORMAL HIGH (ref <130)
Total CHOL/HDL Ratio: 3.9 (calc) (ref <5.0)
Triglycerides: 167 mg/dL — ABNORMAL HIGH (ref <150)

## 2024-02-03 MED ORDER — REPATHA SURECLICK 140 MG/ML ~~LOC~~ SOAJ: 140.0000 mg | SUBCUTANEOUS | 2 refills | Status: AC

## 2024-02-03 NOTE — Telephone Encounter (Signed)
 Source  Joyce Richard (Patient)   Subject  Joyce Richard (Patient)   Topic  Clinical - Lab/Test Results    Communication  Reason for CRM: Went over the results with the patient and she is okay with understanding and she is okay with Repatha > twice monthly injection to reduce cholesterol as long as the insurance will pay. Could you assist? Patients callback number is [phone].        [id number] [address], [place] - [address]    [address] [place] [address]    Phone: [phone] Fax: [phone]  Hours: Not open 24 hours

## 2024-02-03 NOTE — Telephone Encounter (Signed)
 RX sent

## 2024-02-06 LAB — OPHTHALMOLOGY REPORT-SCANNED

## 2024-02-11 ENCOUNTER — Telehealth: Payer: Self-pay

## 2024-02-11 NOTE — Telephone Encounter (Signed)
 Copied from CRM [id number]. Topic: General - Other >> Feb 11, 2024  8:49 AM Joyce Richard wrote: Reason for CRM: Patient is calling to request a callback from [facility] about her numbers from her blood work. Callback number is [phone].

## 2024-02-11 NOTE — Telephone Encounter (Signed)
 Spoke with patient told her what her cholesterol and blood sugar levels were.

## 2024-07-30 ENCOUNTER — Ambulatory Visit: Admitting: Orthopedic Surgery
